# Patient Record
Sex: Male | Born: 1950 | Race: White | Hispanic: No | Marital: Married | Smoking: Current some day smoker
Health system: Southern US, Community
[De-identification: ages and names within clinical notes are randomized; demographics above are authoritative.]

## PROBLEM LIST (undated history)

## (undated) DIAGNOSIS — E785 Hyperlipidemia, unspecified: Secondary | ICD-10-CM

## (undated) DIAGNOSIS — F32A Depression, unspecified: Secondary | ICD-10-CM

## (undated) DIAGNOSIS — F329 Major depressive disorder, single episode, unspecified: Secondary | ICD-10-CM

## (undated) DIAGNOSIS — C801 Malignant (primary) neoplasm, unspecified: Secondary | ICD-10-CM

## (undated) DIAGNOSIS — M87051 Idiopathic aseptic necrosis of right femur: Secondary | ICD-10-CM

## (undated) DIAGNOSIS — M1990 Unspecified osteoarthritis, unspecified site: Secondary | ICD-10-CM

## (undated) DIAGNOSIS — I10 Essential (primary) hypertension: Secondary | ICD-10-CM

## (undated) HISTORY — PX: INGUINAL HERNIA REPAIR: SUR1180

## (undated) HISTORY — PX: ORCHIECTOMY: SHX2116

---

## 2004-07-16 ENCOUNTER — Ambulatory Visit: Payer: Self-pay | Admitting: General Surgery

## 2010-07-29 ENCOUNTER — Ambulatory Visit: Payer: Self-pay | Admitting: Sports Medicine

## 2011-01-19 ENCOUNTER — Emergency Department: Payer: Self-pay | Admitting: Internal Medicine

## 2011-02-01 ENCOUNTER — Ambulatory Visit: Payer: Self-pay | Admitting: Sports Medicine

## 2011-05-18 ENCOUNTER — Ambulatory Visit: Payer: Self-pay | Admitting: Sports Medicine

## 2011-08-08 ENCOUNTER — Other Ambulatory Visit: Payer: Self-pay | Admitting: Orthopedic Surgery

## 2011-08-18 ENCOUNTER — Other Ambulatory Visit: Payer: Self-pay

## 2011-08-18 ENCOUNTER — Encounter (HOSPITAL_COMMUNITY): Payer: Self-pay

## 2011-08-18 ENCOUNTER — Encounter (HOSPITAL_COMMUNITY): Payer: Self-pay | Admitting: Pharmacy Technician

## 2011-08-18 ENCOUNTER — Encounter (HOSPITAL_COMMUNITY)
Admission: RE | Admit: 2011-08-18 | Discharge: 2011-08-18 | Disposition: A | Discharge status: Home / Self Care | Payer: Worker's Compensation | Source: Ambulatory Visit | Attending: Orthopedic Surgery | Admitting: Orthopedic Surgery

## 2011-08-18 HISTORY — DX: Essential (primary) hypertension: I10

## 2011-08-18 HISTORY — DX: Unspecified osteoarthritis, unspecified site: M19.90

## 2011-08-18 LAB — CBC
HCT: 45.9 % (ref 39.0–52.0)
MCH: 33.3 pg (ref 26.0–34.0)
MCHC: 36.6 g/dL — ABNORMAL HIGH (ref 30.0–36.0)
MCV: 91.1 fL (ref 78.0–100.0)
RDW: 12.6 % (ref 11.5–15.5)

## 2011-08-18 LAB — DIFFERENTIAL
Basophils Absolute: 0.1 10*3/uL (ref 0.0–0.1)
Basophils Relative: 1 % (ref 0–1)
Eosinophils Absolute: 0.3 10*3/uL (ref 0.0–0.7)
Eosinophils Relative: 3 % (ref 0–5)
Monocytes Absolute: 0.9 10*3/uL (ref 0.1–1.0)
Neutro Abs: 4.9 10*3/uL (ref 1.7–7.7)

## 2011-08-18 LAB — BASIC METABOLIC PANEL
BUN: 27 mg/dL — ABNORMAL HIGH (ref 6–23)
Chloride: 103 mEq/L (ref 96–112)
Creatinine, Ser: 0.97 mg/dL (ref 0.50–1.35)
GFR calc Af Amer: >90 mL/min (ref >90)

## 2011-08-18 LAB — URINALYSIS, ROUTINE W REFLEX MICROSCOPIC
Glucose, UA: NEGATIVE mg/dL
Hgb urine dipstick: NEGATIVE
Leukocytes, UA: NEGATIVE
Specific Gravity, Urine: 1.027 (ref 1.005–1.030)
pH: 5.5 (ref 5.0–8.0)

## 2011-08-18 LAB — SURGICAL PCR SCREEN
MRSA, PCR: NEGATIVE
Staphylococcus aureus: NEGATIVE

## 2011-08-18 NOTE — Pre-Procedure Instructions (Addendum)
20 Robert Lester  08/18/2011   Your procedure is scheduled on:  April  8@0730   Report to [facility] at 0530 AM.  Call this number if you have problems the morning of surgery: [phone]   Remember:   Do not eat food:After Midnight.  May have clear liquids: up to 4 Hours before arrival.  Clear liquids include soda, tea, black coffee, apple or grape juice, broth.  Take these medicines the morning of surgery with A SIP OF WATER: BUSPAR,CELEXA,METPROLOL  Do not wear jewelry, make-up or nail polish.  Do not wear lotions, powders, or perfumes. You may wear deodorant.  Do not shave 48 hours prior to surgery.  Do not bring valuables to the hospital.  Contacts, dentures or bridgework may not be worn into surgery.  Leave suitcase in the car. After surgery it may be brought to your room.  For patients admitted to the hospital, checkout time is 11:00 AM the day of discharge.   Patients discharged the day of surgery will not be allowed to drive home.  Name and phone number of your driver: FAMILY  Special Instructions: CHG Shower Use Special Wash: 1/2 bottle night before surgery and 1/2 bottle morning of surgery.   Please read over the following fact sheets that you were given: Pain Booklet, Coughing and Deep Breathing, Blood Transfusion Information, Total Joint Packet, MRSA Information and Surgical Site Infection Prevention
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
## 2011-08-21 MED ORDER — CEFAZOLIN SODIUM-DEXTROSE 2-3 GM-% IV SOLR
2.0000 g | INTRAVENOUS | Status: AC
  Administered 2011-08-22: 2 g via INTRAVENOUS
  Filled 2011-08-21: qty 50

## 2011-08-22 ENCOUNTER — Encounter (HOSPITAL_COMMUNITY): Payer: Self-pay | Admitting: Anesthesiology

## 2011-08-22 ENCOUNTER — Ambulatory Visit (HOSPITAL_COMMUNITY): Payer: Worker's Compensation

## 2011-08-22 ENCOUNTER — Inpatient Hospital Stay (HOSPITAL_COMMUNITY)
Admission: RE | Admit: 2011-08-22 | Discharge: 2011-08-24 | DRG: 470 | Disposition: A | Discharge status: Home Health | Payer: Worker's Compensation | Source: Ambulatory Visit | Attending: Orthopedic Surgery | Admitting: Orthopedic Surgery

## 2011-08-22 ENCOUNTER — Encounter (HOSPITAL_COMMUNITY): Payer: Self-pay | Admitting: Orthopedic Surgery

## 2011-08-22 ENCOUNTER — Encounter (HOSPITAL_COMMUNITY): Payer: Self-pay | Admitting: *Deleted

## 2011-08-22 ENCOUNTER — Ambulatory Visit (HOSPITAL_COMMUNITY): Payer: Worker's Compensation | Admitting: Anesthesiology

## 2011-08-22 ENCOUNTER — Encounter (HOSPITAL_COMMUNITY)
Admission: RE | Disposition: A | Discharge status: Home Health | Payer: Self-pay | Source: Ambulatory Visit | Attending: Orthopedic Surgery

## 2011-08-22 DIAGNOSIS — I10 Essential (primary) hypertension: Secondary | ICD-10-CM | POA: Diagnosis present

## 2011-08-22 DIAGNOSIS — M1610 Unilateral primary osteoarthritis, unspecified hip: Principal | ICD-10-CM

## 2011-08-22 DIAGNOSIS — Z79899 Other long term (current) drug therapy: Secondary | ICD-10-CM

## 2011-08-22 DIAGNOSIS — Z7901 Long term (current) use of anticoagulants: Secondary | ICD-10-CM

## 2011-08-22 DIAGNOSIS — Z87891 Personal history of nicotine dependence: Secondary | ICD-10-CM

## 2011-08-22 DIAGNOSIS — M169 Osteoarthritis of hip, unspecified: Principal | ICD-10-CM | POA: Diagnosis present

## 2011-08-22 DIAGNOSIS — M8708 Idiopathic aseptic necrosis of bone, other site: Secondary | ICD-10-CM | POA: Diagnosis present

## 2011-08-22 HISTORY — PX: TOTAL HIP ARTHROPLASTY: SHX124

## 2011-08-22 SURGERY — ARTHROPLASTY, HIP, TOTAL,POSTERIOR APPROACH
Anesthesia: General | Site: Hip | Laterality: Right | Wound class: Clean

## 2011-08-22 MED ORDER — ZOLPIDEM TARTRATE 5 MG PO TABS: 5.0000 mg | ORAL_TABLET | Freq: Every evening | ORAL | Status: DC | PRN

## 2011-08-22 MED ORDER — METOPROLOL SUCCINATE ER 25 MG PO TB24
25.0000 mg | ORAL_TABLET | Freq: Every day | ORAL | Status: DC
  Filled 2011-08-22: qty 1

## 2011-08-22 MED ORDER — CITALOPRAM HYDROBROMIDE 20 MG PO TABS
20.0000 mg | ORAL_TABLET | Freq: Every day | ORAL | Status: DC
  Administered 2011-08-22 – 2011-08-24 (×3): 20 mg via ORAL
  Filled 2011-08-22 (×3): qty 1

## 2011-08-22 MED ORDER — CELECOXIB 200 MG PO CAPS
200.0000 mg | ORAL_CAPSULE | Freq: Two times a day (BID) | ORAL | Status: DC
  Administered 2011-08-22 – 2011-08-24 (×5): 200 mg via ORAL
  Filled 2011-08-22 (×6): qty 1

## 2011-08-22 MED ORDER — LISINOPRIL 10 MG PO TABS
10.0000 mg | ORAL_TABLET | Freq: Every day | ORAL | Status: DC
  Administered 2011-08-22: 10 mg via ORAL
  Filled 2011-08-22 (×3): qty 1

## 2011-08-22 MED ORDER — PROPOFOL 10 MG/ML IV EMUL
INTRAVENOUS | Status: DC | PRN
  Administered 2011-08-22: 50 mg via INTRAVENOUS
  Administered 2011-08-22: 150 mg via INTRAVENOUS

## 2011-08-22 MED ORDER — ACETAMINOPHEN 325 MG PO TABS: 650.0000 mg | ORAL_TABLET | Freq: Four times a day (QID) | ORAL | Status: DC | PRN

## 2011-08-22 MED ORDER — METHOCARBAMOL 500 MG PO TABS
500.0000 mg | ORAL_TABLET | Freq: Four times a day (QID) | ORAL | Status: DC | PRN
  Administered 2011-08-22 – 2011-08-24 (×3): 500 mg via ORAL
  Filled 2011-08-22 (×5): qty 1

## 2011-08-22 MED ORDER — METOCLOPRAMIDE HCL 5 MG/ML IJ SOLN: 5.0000 mg | Freq: Three times a day (TID) | INTRAMUSCULAR | Status: DC | PRN

## 2011-08-22 MED ORDER — ALUM & MAG HYDROXIDE-SIMETH 200-200-20 MG/5ML PO SUSP: 30.0000 mL | ORAL | Status: DC | PRN

## 2011-08-22 MED ORDER — ROCURONIUM BROMIDE 100 MG/10ML IV SOLN
INTRAVENOUS | Status: DC | PRN
  Administered 2011-08-22: 50 mg via INTRAVENOUS

## 2011-08-22 MED ORDER — CHLORHEXIDINE GLUCONATE 4 % EX LIQD: 60.0000 mL | Freq: Once | CUTANEOUS | Status: DC

## 2011-08-22 MED ORDER — LIDOCAINE HCL (CARDIAC) 20 MG/ML IV SOLN
INTRAVENOUS | Status: DC | PRN
  Administered 2011-08-22: 100 mg via INTRAVENOUS

## 2011-08-22 MED ORDER — LACTATED RINGERS IV SOLN
INTRAVENOUS | Status: DC | PRN
  Administered 2011-08-22 (×2): via INTRAVENOUS

## 2011-08-22 MED ORDER — ONDANSETRON HCL 4 MG/2ML IJ SOLN: 4.0000 mg | Freq: Four times a day (QID) | INTRAMUSCULAR | Status: DC | PRN

## 2011-08-22 MED ORDER — BUPIVACAINE-EPINEPHRINE 0.5% -1:200000 IJ SOLN
INTRAMUSCULAR | Status: DC | PRN
  Administered 2011-08-22: 10 mL

## 2011-08-22 MED ORDER — PHENYLEPHRINE HCL 10 MG/ML IJ SOLN
INTRAMUSCULAR | Status: DC | PRN
  Administered 2011-08-22: 80 ug via INTRAVENOUS

## 2011-08-22 MED ORDER — WARFARIN SODIUM 7.5 MG PO TABS
7.5000 mg | ORAL_TABLET | Freq: Once | ORAL | Status: AC
  Administered 2011-08-22: 7.5 mg via ORAL
  Filled 2011-08-22: qty 1

## 2011-08-22 MED ORDER — HYDROMORPHONE HCL PF 1 MG/ML IJ SOLN: 0.5000 mg | INTRAMUSCULAR | Status: DC | PRN

## 2011-08-22 MED ORDER — SIMVASTATIN 20 MG PO TABS
20.0000 mg | ORAL_TABLET | Freq: Every day | ORAL | Status: DC
  Administered 2011-08-22 – 2011-08-23 (×2): 20 mg via ORAL
  Filled 2011-08-22 (×3): qty 1

## 2011-08-22 MED ORDER — OXYCODONE HCL 5 MG PO TABS
5.0000 mg | ORAL_TABLET | ORAL | Status: DC | PRN
  Administered 2011-08-23: 10 mg via ORAL
  Filled 2011-08-22: qty 2

## 2011-08-22 MED ORDER — BUSPIRONE HCL 15 MG PO TABS
15.0000 mg | ORAL_TABLET | Freq: Two times a day (BID) | ORAL | Status: DC
  Administered 2011-08-22 – 2011-08-24 (×5): 15 mg via ORAL
  Filled 2011-08-22 (×9): qty 1

## 2011-08-22 MED ORDER — BISACODYL 10 MG RE SUPP: 10.0000 mg | Freq: Every day | RECTAL | Status: DC | PRN

## 2011-08-22 MED ORDER — METHOCARBAMOL 100 MG/ML IJ SOLN
500.0000 mg | Freq: Four times a day (QID) | INTRAVENOUS | Status: DC | PRN
  Administered 2011-08-22: 500 mg via INTRAVENOUS
  Filled 2011-08-22: qty 5

## 2011-08-22 MED ORDER — COUMADIN BOOK
Freq: Once | Status: AC
  Administered 2011-08-22: 12:00:00
  Filled 2011-08-22: qty 1

## 2011-08-22 MED ORDER — ACETAMINOPHEN 650 MG RE SUPP: 650.0000 mg | Freq: Four times a day (QID) | RECTAL | Status: DC | PRN

## 2011-08-22 MED ORDER — MENTHOL 3 MG MT LOZG: 1.0000 | LOZENGE | OROMUCOSAL | Status: DC | PRN

## 2011-08-22 MED ORDER — DEXTROSE 5 % IV SOLN
INTRAVENOUS | Status: DC | PRN
  Administered 2011-08-22 (×2): via INTRAVENOUS

## 2011-08-22 MED ORDER — FLEET ENEMA 7-19 GM/118ML RE ENEM: 1.0000 | ENEMA | Freq: Once | RECTAL | Status: AC | PRN

## 2011-08-22 MED ORDER — PROMETHAZINE HCL 25 MG/ML IJ SOLN: 6.2500 mg | INTRAMUSCULAR | Status: DC | PRN

## 2011-08-22 MED ORDER — MIDAZOLAM HCL 5 MG/5ML IJ SOLN
INTRAMUSCULAR | Status: DC | PRN
  Administered 2011-08-22: 2 mg via INTRAVENOUS

## 2011-08-22 MED ORDER — ACETAMINOPHEN 10 MG/ML IV SOLN
INTRAVENOUS | Status: AC
  Filled 2011-08-22: qty 100

## 2011-08-22 MED ORDER — WARFARIN VIDEO
Freq: Once | Status: AC
Start: 2011-08-23 — End: 2011-08-23
  Administered 2011-08-23: 12:00:00

## 2011-08-22 MED ORDER — OXYCODONE-ACETAMINOPHEN 5-325 MG PO TABS
1.0000 | ORAL_TABLET | ORAL | Status: DC | PRN
  Administered 2011-08-23: 2 via ORAL
  Administered 2011-08-23 (×2): 1 via ORAL
  Administered 2011-08-24: 2 via ORAL
  Filled 2011-08-22 (×3): qty 2

## 2011-08-22 MED ORDER — MAGNESIUM HYDROXIDE 400 MG/5ML PO SUSP: 30.0000 mL | Freq: Every day | ORAL | Status: DC | PRN

## 2011-08-22 MED ORDER — ACETAMINOPHEN 10 MG/ML IV SOLN
INTRAVENOUS | Status: DC | PRN
  Administered 2011-08-22: 1000 mg via INTRAVENOUS

## 2011-08-22 MED ORDER — KCL IN DEXTROSE-NACL 20-5-0.45 MEQ/L-%-% IV SOLN
INTRAVENOUS | Status: DC
Start: 2011-08-22 — End: 2011-08-24
  Administered 2011-08-22 – 2011-08-23 (×4): via INTRAVENOUS
  Filled 2011-08-22 (×10): qty 1000

## 2011-08-22 MED ORDER — NEOSTIGMINE METHYLSULFATE 1 MG/ML IJ SOLN
INTRAMUSCULAR | Status: DC | PRN
  Administered 2011-08-22: 3 mg via INTRAVENOUS

## 2011-08-22 MED ORDER — METOCLOPRAMIDE HCL 10 MG PO TABS: 5.0000 mg | ORAL_TABLET | Freq: Three times a day (TID) | ORAL | Status: DC | PRN

## 2011-08-22 MED ORDER — KCL IN DEXTROSE-NACL 20-5-0.45 MEQ/L-%-% IV SOLN
INTRAVENOUS | Status: AC
  Filled 2011-08-22: qty 1000

## 2011-08-22 MED ORDER — ONDANSETRON HCL 4 MG/2ML IJ SOLN
INTRAMUSCULAR | Status: DC | PRN
  Administered 2011-08-22: 4 mg via INTRAVENOUS

## 2011-08-22 MED ORDER — DIPHENHYDRAMINE HCL 12.5 MG/5ML PO ELIX: 12.5000 mg | ORAL_SOLUTION | ORAL | Status: DC | PRN

## 2011-08-22 MED ORDER — SODIUM CHLORIDE 0.9 % IR SOLN
Status: DC | PRN
  Administered 2011-08-22: 1000 mL

## 2011-08-22 MED ORDER — HYDROCODONE-ACETAMINOPHEN 5-325 MG PO TABS
1.0000 | ORAL_TABLET | ORAL | Status: DC | PRN
  Administered 2011-08-22: 1 via ORAL
  Administered 2011-08-23 – 2011-08-24 (×2): 2 via ORAL
  Filled 2011-08-22: qty 1
  Filled 2011-08-22 (×2): qty 2

## 2011-08-22 MED ORDER — WARFARIN - PHARMACIST DOSING INPATIENT: Freq: Every day | Status: DC

## 2011-08-22 MED ORDER — METOPROLOL SUCCINATE ER 25 MG PO TB24
25.0000 mg | ORAL_TABLET | Freq: Every day | ORAL | Status: DC
  Administered 2011-08-22: 25 mg via ORAL
  Filled 2011-08-22 (×3): qty 1

## 2011-08-22 MED ORDER — GLYCOPYRROLATE 0.2 MG/ML IJ SOLN
INTRAMUSCULAR | Status: DC | PRN
  Administered 2011-08-22: 0.4 mg via INTRAVENOUS

## 2011-08-22 MED ORDER — PHENOL 1.4 % MT LIQD: 1.0000 | OROMUCOSAL | Status: DC | PRN

## 2011-08-22 MED ORDER — FENTANYL CITRATE 0.05 MG/ML IJ SOLN
INTRAMUSCULAR | Status: DC | PRN
  Administered 2011-08-22 (×2): 100 ug via INTRAVENOUS
  Administered 2011-08-22: 50 ug via INTRAVENOUS

## 2011-08-22 MED ORDER — ZOLPIDEM TARTRATE 10 MG PO TABS: 10.0000 mg | ORAL_TABLET | Freq: Every evening | ORAL | Status: DC | PRN

## 2011-08-22 MED ORDER — DEXTROSE-NACL 5-0.45 % IV SOLN: INTRAVENOUS | Status: DC

## 2011-08-22 MED ORDER — HYDROMORPHONE HCL PF 1 MG/ML IJ SOLN
0.2500 mg | INTRAMUSCULAR | Status: DC | PRN
  Administered 2011-08-22 (×2): 0.5 mg via INTRAVENOUS

## 2011-08-22 MED ORDER — ONDANSETRON HCL 4 MG PO TABS: 4.0000 mg | ORAL_TABLET | Freq: Four times a day (QID) | ORAL | Status: DC | PRN

## 2011-08-22 MED ORDER — METOPROLOL SUCCINATE ER 25 MG PO TB24
25.0000 mg | ORAL_TABLET | Freq: Once | ORAL | Status: AC
  Administered 2011-08-22: 25 mg via ORAL

## 2011-08-22 SURGICAL SUPPLY — 60 items
BLADE SAW SAG 73X25 THK (BLADE) ×1
BLADE SAW SGTL 18X1.27X75 (BLADE) IMPLANT
BLADE SAW SGTL 73X25 THK (BLADE) ×1 IMPLANT
BLADE SAW SGTL MED 73X18.5 STR (BLADE) IMPLANT
BRUSH FEMORAL CANAL (MISCELLANEOUS) IMPLANT
CLOTH BEACON ORANGE TIMEOUT ST (SAFETY) ×2 IMPLANT
COVER BACK TABLE 24X17X13 BIG (DRAPES) IMPLANT
COVER SURGICAL LIGHT HANDLE (MISCELLANEOUS) ×4 IMPLANT
DRAPE ORTHO SPLIT 77X108 STRL (DRAPES) ×1
DRAPE PROXIMA HALF (DRAPES) ×2 IMPLANT
DRAPE SURG ORHT 6 SPLT 77X108 (DRAPES) ×1 IMPLANT
DRAPE U-SHAPE 47X51 STRL (DRAPES) ×2 IMPLANT
DRILL BIT 7/64X5 (BIT) ×2 IMPLANT
DRSG MEPILEX BORDER 4X12 (GAUZE/BANDAGES/DRESSINGS) IMPLANT
DRSG MEPILEX BORDER 4X4 (GAUZE/BANDAGES/DRESSINGS) ×2 IMPLANT
DRSG MEPILEX BORDER 4X8 (GAUZE/BANDAGES/DRESSINGS) IMPLANT
DURAPREP 26ML APPLICATOR (WOUND CARE) ×2 IMPLANT
ELECT BLADE 4.0 EZ CLEAN MEGAD (MISCELLANEOUS) ×2
ELECT REM PT RETURN 9FT ADLT (ELECTROSURGICAL) ×2
ELECTRODE BLDE 4.0 EZ CLN MEGD (MISCELLANEOUS) ×1 IMPLANT
ELECTRODE REM PT RTRN 9FT ADLT (ELECTROSURGICAL) ×1 IMPLANT
FLOSEAL 10ML (HEMOSTASIS) IMPLANT
GAUZE XEROFORM 1X8 LF (GAUZE/BANDAGES/DRESSINGS) IMPLANT
GLOVE BIO SURGEON STRL SZ7 (GLOVE) ×2 IMPLANT
GLOVE BIO SURGEON STRL SZ7.5 (GLOVE) ×2 IMPLANT
GLOVE BIO SURGEON STRL SZ8.5 (GLOVE) ×2 IMPLANT
GLOVE BIOGEL PI IND STRL 6.5 (GLOVE) ×1 IMPLANT
GLOVE BIOGEL PI IND STRL 7.0 (GLOVE) ×1 IMPLANT
GLOVE BIOGEL PI IND STRL 8 (GLOVE) ×1 IMPLANT
GLOVE BIOGEL PI INDICATOR 6.5 (GLOVE) ×1
GLOVE BIOGEL PI INDICATOR 7.0 (GLOVE) ×1
GLOVE BIOGEL PI INDICATOR 8 (GLOVE) ×1
GLOVE SURG SS PI 8.5 STRL IVOR (GLOVE) ×1
GLOVE SURG SS PI 8.5 STRL STRW (GLOVE) ×1 IMPLANT
GOWN PREVENTION PLUS XLARGE (GOWN DISPOSABLE) ×4 IMPLANT
GOWN STRL NON-REIN LRG LVL3 (GOWN DISPOSABLE) ×4 IMPLANT
HANDPIECE INTERPULSE COAX TIP (DISPOSABLE)
HOOD PEEL AWAY FACE SHEILD DIS (HOOD) ×4 IMPLANT
KIT BASIN OR (CUSTOM PROCEDURE TRAY) ×2 IMPLANT
KIT ROOM TURNOVER OR (KITS) ×2 IMPLANT
MANIFOLD NEPTUNE II (INSTRUMENTS) ×2 IMPLANT
NEEDLE 22X1 1/2 (OR ONLY) (NEEDLE) ×2 IMPLANT
NS IRRIG 1000ML POUR BTL (IV SOLUTION) ×2 IMPLANT
PACK TOTAL JOINT (CUSTOM PROCEDURE TRAY) ×2 IMPLANT
PAD ARMBOARD 7.5X6 YLW CONV (MISCELLANEOUS) ×4 IMPLANT
PASSER SUT SWANSON 36MM LOOP (INSTRUMENTS) ×2 IMPLANT
PRESSURIZER FEMORAL UNIV (MISCELLANEOUS) IMPLANT
SET HNDPC FAN SPRY TIP SCT (DISPOSABLE) IMPLANT
SUT ETHIBOND 2 V 37 (SUTURE) ×2 IMPLANT
SUT ETHILON 3 0 FSL (SUTURE) ×2 IMPLANT
SUT VIC AB 0 CTB1 27 (SUTURE) ×2 IMPLANT
SUT VIC AB 1 CTX 36 (SUTURE) ×1
SUT VIC AB 1 CTX36XBRD ANBCTR (SUTURE) ×1 IMPLANT
SUT VIC AB 2-0 CTB1 (SUTURE) ×2 IMPLANT
SYR CONTROL 10ML LL (SYRINGE) ×2 IMPLANT
TOWEL OR 17X24 6PK STRL BLUE (TOWEL DISPOSABLE) ×2 IMPLANT
TOWEL OR 17X26 10 PK STRL BLUE (TOWEL DISPOSABLE) ×2 IMPLANT
TOWER CARTRIDGE SMART MIX (DISPOSABLE) IMPLANT
TRAY FOLEY CATH 14FR (SET/KITS/TRAYS/PACK) ×2 IMPLANT
WATER STERILE IRR 1000ML POUR (IV SOLUTION) ×4 IMPLANT

## 2011-08-22 NOTE — Progress Notes (Signed)
Orthopedic Tech Progress Note Patient Details:  Robert Lester [birth year] [id number]  Patient ID: [name], male   DOB: 1951/01/24, 61 y.o.   MRN: [id number]   Shawnie Pons 08/22/2011, 4:32 PM Trapeze bar

## 2011-08-22 NOTE — Op Note (Signed)
OPERATIVE REPORT    DATE OF PROCEDURE:  08/22/2011       PREOPERATIVE DIAGNOSIS:  OSTEOARTHRITIS RIGHT HIP                                                          POSTOPERATIVE DIAGNOSIS:  OSTEOARTHRITIS RIGHT HIP                                                           PROCEDURE:  R total hip arthroplasty using a 54 mm DePuy Pinnacle  Cup, [employer], 10-degree polyethylene liner index superior  and posterior, a +0 36 mm ceramic head, a [id number] SROM stem, 18Fsm Cone   SURGEON: Carina Chaplin J    ASSISTANT:   Mauricia Area, PA-C  (present throughout entire procedure and necessary for timely completion of the procedure)   ANESTHESIA: General BLOOD LOSS: 300 FLUID REPLACEMENT: 1500 crystalloid DRAINS: Foley Catheter URINE OUTPUT: 300cc COMPLICATIONS: none    INDICATIONS FOR PROCEDURE: A 61 y.o. year-old With  OSTEOARTHRITIS RIGHT HIP   for 1 years, x-rays show bone-on-bone arthritic changes. Despite conservative measures with observation, anti-inflammatory medicine, narcotics, use of a cane, has severe unremitting pain and can ambulate only a few blocks before resting.  Patient desires elective R total hip arthroplasty to decrease pain and increase function. The risks, benefits, and alternatives were discussed at length including but not limited to the risks of infection, bleeding, nerve injury, stiffness, blood clots, the need for revision surgery, cardiopulmonary complications, among others, and they were willing to proceed.y have been discussed. Questions answered.     PROCEDURE IN DETAIL: The patient was identified by armband,  received preoperative IV antibiotics in the holding area at [facility], taken to the operating room , appropriate anesthetic monitors  were attached and general endotracheal anesthesia induced. Foley catheter was inserted. He was rolled into the L lateral decubitus position and fixed there with a Stulberg Mark II pelvic clamp and the R    lower extremity was then prepped and draped  in the usual sterile fashion from the ankle to the hemipelvis. A time-out  procedure was performed. The skin along the lateral hip and thigh  infiltrated with 10 mL of 0.5% Marcaine and epinephrine solution. We  then made a posterolateral approach to the hip. With a #10 blade, 12 cm  incision through skin and subcutaneous tissue down to the level of the  IT band. Small bleeders were identified and cauterized. IT band cut in  line with skin incision exposing the greater trochanter. A Cobra retractor was placed between the gluteus minimus and the superior hip joint capsule, and a spiked Cobra between the quadratus femoris and the inferior hip joint capsule. This isolated the short  external rotators and piriformis tendons. These were tagged with a #2 Ethibond  suture and cut off their insertion on the intertrochanteric crest. The posterior  capsule was then developed into an acetabular-based flap from Posterior Superior off of the acetabulum out over the femoral neck and back posterior inferior to the acetabular rim. This flap was tagged with two #2 Ethibond sutures and  retracted protecting the sciatic nerve. This exposed the arthritic femoral head and osteophytes. The hip was then flexed and internally rotated, dislocating the femoral head and a standard neck cut performed 1 fingerbreadth above the lesser trochanter.  A spiked Cobra was placed in the cotyloid notch and a Hohmann retractor was then used to lever the femur anteriorly off of the anterior pelvic column. A posterior-inferior wing retractor was placed at the junction of the acetabulum and the ischium completing the acetabular exposure.We then removed the peripheral osteophytes and labrum from the acetabulum. We then reamed the acetabulum up to 53 mm with basket reamers obtaining good coverage in all quadrants, irrigated out with normal  saline solution and hammered into place a 54 mm pinnacle cup  in 45  degrees of abduction and about 20 degrees of anteversion. More  peripheral osteophytes removed and a trial 10-degree liner placed with the  index superior-posterior. The hip was then flexed and internally rotated exposing the  proximal femur, which was entered with the initiating reamer followed by  the axial reamers up to a 13.5 mm full depth and 14mm partial depth. We then conically reamed to 78F to the correct depth for a 42 base neck. The calcar was milled to 78Fsm. A trial cone and stem was inserted in the 25 degrees anteversion, with a +0 36mm trial head. Trial reduction was then performed and excellent stability was noted with at 90 of flexion with 75 of internal rotation and then full extension with maximal external rotation. The hip could not be dislocated in full extension. The knee could easily flex  to about 130 degrees. We also stretched the abductors at this point,  because of the preexisting adductor contractures. All trial components  were then removed. The acetabulum was irrigated out with normal saline  solution. A titanium Apex [facility] was then screwed into place  followed by a 10-degree polyethylene liner index superior-posterior. On  the femoral side a 78Fsm ZTT1 cone was hammered into place, followed by a [id number] SROM stem in 25 degrees of anteversion. At this point, a +0 36 mm ceramic head was  hammered on the stem. The hip was reduced. We checked our stability  one more time and found to be excellent. The wound was once again  thoroughly irrigated out with normal saline solution pulse lavage. The  capsular flap and short external rotators were repaired back to the  intertrochanteric crest through drill holes with a #2 Ethibond suture.  The IT band was closed with running 1 Vicryl suture. The subcutaneous  tissue with 0 and 2-0 undyed Vicryl suture and the skin with running  interlocking 3-0 nylon suture. Dressing of Xeroform and Mepilex was  then  applied. The patient was then unclamped, rolled supine, awaken extubated and taken to recovery room without difficulty in stable condition.   Joyceline Maiorino J 08/22/2011, 8:47 AM

## 2011-08-22 NOTE — Interval H&P Note (Signed)
History and Physical Interval Note:  08/22/2011 7:17 AM  Robert Lester  has presented today for surgery, with the diagnosis of OSTEOARTHRITIS RIGHT HIP  The various methods of treatment have been discussed with the patient and family. After consideration of risks, benefits and other options for treatment, the patient has consented to  Procedure(s) (LRB): TOTAL HIP ARTHROPLASTY (Right) as a surgical intervention .  The patients' history has been reviewed, patient examined, no change in status, stable for surgery.  I have reviewed the patients' chart and labs.  Questions were answered to the patient's satisfaction.     [name]

## 2011-08-22 NOTE — Transfer of Care (Signed)
Immediate Anesthesia Transfer of Care Note  Patient: Robert Lester  Procedure(s) Performed: Procedure(s) (LRB): TOTAL HIP ARTHROPLASTY (Right)  Patient Location: PACU  Anesthesia Type: General  Level of Consciousness: awake, alert , oriented and patient cooperative  Airway & Oxygen Therapy: Patient Spontanous Breathing and Patient connected to nasal cannula oxygen  Post-op Assessment: Report given to PACU RN and Post -op Vital signs reviewed and stable  Post vital signs: Reviewed and stable  Complications: No apparent anesthesia complications

## 2011-08-22 NOTE — Progress Notes (Signed)
Pt's wife was very concerned about pt not receiving antibiotics postoperatively and repeatedly stated "I don't want you to get an infection while you are here." I explained to her that he received a dose of antibiotics preoperatively and that Dr. Turner Daniels does not require postop antibiotics. I called and clarified this with Dr. Turner Daniels. He stated that this was correct and that he reviewed pt's postop xray which "looked great." Informed pt and wife that I spoke with Dr. Turner Daniels and his comment. They were satisfied with the follow-up and response. Tammy Sours

## 2011-08-22 NOTE — Progress Notes (Signed)
ANTICOAGULATION CONSULT NOTE - Initial Consult  Pharmacy Consult for Coumadin Indication: VTE prophylaxis s/p R THA  No Known Allergies  Patient Measurements: Ht: 70 inches Wt: 89.8 kg   Vital Signs: Temp: 97.7 F (36.5 C) (04/08 1011) Temp src: Oral (04/08 0611) BP: 145/79 mmHg (04/08 1011) Pulse Rate: 68  (04/08 1011)  Labs: No results found for this basename: HGB:2,HCT:3,PLT:3,APTT:3,LABPROT:3,INR:3,HEPARINUNFRC:3,CREATININE:3,CKTOTAL:3,CKMB:3,TROPONINI:3 in the last 72 hours CrCl is unknown because there is no height on file for the current visit.  Medical History: Past Medical History  Diagnosis Date  . Arthritis   . Hypertension     TX DR Doristine Church [facility]    Medications:  Prescriptions prior to admission  Medication Sig Dispense Refill  . busPIRone (BUSPAR) 15 MG tablet Take 15 mg by mouth 2 (two) times daily.      . celecoxib (CELEBREX) 200 MG capsule Take 200 mg by mouth 2 (two) times daily.      . ciprofloxacin (CIPRO) 500 MG tablet Take 500 mg by mouth 2 (two) times daily.      . citalopram (CELEXA) 20 MG tablet Take 20 mg by mouth daily.      Marland Kitchen HYDROcodone-acetaminophen (NORCO) 5-325 MG per tablet Take 1 tablet by mouth every 6 (six) hours as needed. For pain      . HYDROcodone-acetaminophen (VICODIN) 5-500 MG per tablet Take 1 tablet by mouth every 6 (six) hours as needed. For pain      . lisinopril (PRINIVIL,ZESTRIL) 10 MG tablet Take 10 mg by mouth daily.      Marland Kitchen lovastatin (MEVACOR) 20 MG tablet Take 20 mg by mouth at bedtime.      . metoprolol succinate (TOPROL-XL) 25 MG 24 hr tablet Take 25 mg by mouth daily.      Marland Kitchen zolpidem (AMBIEN) 10 MG tablet Take 10 mg by mouth at bedtime as needed. For sleep        Assessment: 61 y.o. male to begin coumadin for VTE prophylaxis s/p R THA 4/8. INR 1.07 at baseline and CBC stable.   Goal of Therapy:  INR 2-3   Plan:  1. Coumadin 7.5 mg po today 2. Coumadin educational booklet and video 3. Daily  PT/INR  Christoper Fabian, PharmD, BCPS Clinical pharmacist, pager [phone] 08/22/2011,11:08 AM

## 2011-08-22 NOTE — Interval H&P Note (Signed)
History and Physical Interval Note:  08/22/2011 7:18 AM  Robert Lester  has presented today for surgery, with the diagnosis of OSTEOARTHRITIS RIGHT HIP  The various methods of treatment have been discussed with the patient and family. After consideration of risks, benefits and other options for treatment, the patient has consented to  Procedure(s) (LRB): TOTAL HIP ARTHROPLASTY (Right) as a surgical intervention .  The patients' history has been reviewed, patient examined, no change in status, stable for surgery.  I have reviewed the patients' chart and labs.  Questions were answered to the patient's satisfaction.     [name]

## 2011-08-22 NOTE — Anesthesia Postprocedure Evaluation (Signed)
  Anesthesia Post-op Note  Patient: Robert Lester  Procedure(s) Performed: Procedure(s) (LRB): TOTAL HIP ARTHROPLASTY (Right)  Patient Location: PACU  Anesthesia Type: General  Level of Consciousness: awake and alert   Airway and Oxygen Therapy: Patient Spontanous Breathing  Post-op Pain: mild  Post-op Assessment: Post-op Vital signs reviewed, Patient's Cardiovascular Status Stable, Respiratory Function Stable, Patent Airway and No signs of Nausea or vomiting  Post-op Vital Signs: stable  Complications: No apparent anesthesia complications

## 2011-08-22 NOTE — Evaluation (Signed)
Physical Therapy Evaluation Patient Details Name: Robert Lester MRN: [id number] DOB: May 24, 1950 Today's Date: 08/22/2011  Problem List:  Patient Active Problem List  Diagnoses  . Hip arthritis Right    Past Medical History:  Past Medical History  Diagnosis Date  . Arthritis   . Hypertension     TX DR Doristine Church [facility]   Past Surgical History:  Past Surgical History  Procedure Date  . Inguinal hernia repair     PT Assessment/Plan/Recommendation PT Assessment Clinical Impression Statement: Pt is a 61 y/o male post op day 0 of Right total hip replacement.  Pt should progress quickly.  Acute PT to follow pt to maximize independence with mobility.  PT Recommendation/Assessment: Patient will need skilled PT in the acute care venue PT Problem List: Decreased strength;Decreased activity tolerance;Decreased mobility;Decreased knowledge of use of DME;Decreased knowledge of precautions;Pain Barriers to Discharge: None PT Therapy Diagnosis : Difficulty walking;Acute pain PT Plan PT Frequency: 7X/week PT Treatment/Interventions: Gait training;Stair training;DME instruction;Functional mobility training;Therapeutic activities;Therapeutic exercise;Neuromuscular re-education;Patient/family education PT Recommendation Follow Up Recommendations: Home health PT Equipment Recommended: None recommended by PT (Pt has all required equipment. ) PT Goals  Acute Rehab PT Goals PT Goal Formulation: With patient Time For Goal Achievement: 7 days Pt will go Supine/Side to Sit: Independently;with HOB 0 degrees PT Goal: Supine/Side to Sit - Progress: Goal set today Pt will go Sit to Supine/Side: Independently;with HOB 0 degrees PT Goal: Sit to Supine/Side - Progress: Goal set today Pt will Transfer Bed to Chair/Chair to Bed: Independently PT Transfer Goal: Bed to Chair/Chair to Bed - Progress: Goal set today Pt will Ambulate: >150 feet;with modified independence;with least restrictive  assistive device PT Goal: Ambulate - Progress: Goal set today Pt will Go Up / Down Stairs: 3-5 stairs;with modified independence;with least restrictive assistive device PT Goal: Up/Down Stairs - Progress: Goal set today Pt will Perform Home Exercise Program: Independently PT Goal: Perform Home Exercise Program - Progress: Goal set today Additional Goals Additional Goal #1: Pt will verbalize and Demonstrate 3/3 posterior hip precautions without prompting.  PT Goal: Additional Goal #1 - Progress: Goal set today  PT Evaluation Precautions/Restrictions  Precautions Precautions: Posterior Hip Precaution Booklet Issued: Yes (comment) Required Braces or Orthoses: No Restrictions Weight Bearing Restrictions: Yes RLE Weight Bearing: Weight bearing as tolerated Prior Functioning  Home Living Lives With: Spouse Receives Help From: Family Type of Home: House Home Layout: One level Home Access: Stairs to enter Entrance Stairs-Rails: Left Entrance Stairs-Number of Steps: 3 Bathroom Shower/Tub: Psychologist, counselling;Door [employer] Toilet: Standard Bathroom Accessibility: Yes How Accessible: Accessible via wheelchair;Accessible via walker Home Adaptive Equipment: Bedside commode/3-in-1;Built-in shower seat;Grab bars around toilet;Grab bars in shower;Hand-held shower hose;Walker - rolling Prior Function Level of Independence: Independent with basic ADLs;Independent with homemaking with ambulation;Independent with gait;Independent with transfers Able to Take Stairs?: Yes Driving: Yes Vocation: Full time employment Vocation Requirements: Engineer, drilling.  Leisure: Hobbies-yes (Comment) Comments: Motorcycle.  Cognition Cognition Arousal/Alertness: Awake/alert Overall Cognitive Status: Appears within functional limits for tasks assessed Orientation Level: Oriented X4 Sensation/Coordination Sensation Light Touch: Appears Intact Stereognosis: Not tested Hot/Cold: Not tested Proprioception:  Appears Intact Coordination Gross Motor Movements are Fluid and Coordinated: Yes Fine Motor Movements are Fluid and Coordinated: Yes Extremity Assessment RUE Assessment RUE Assessment: Within Functional Limits LUE Assessment LUE Assessment: Within Functional Limits RLE Assessment RLE Assessment: Exceptions to [facility] RLE Strength RLE Overall Strength: Deficits;Due to pain;Other (Comment) RLE Overall Strength Comments: 3-/5 gross hip strength.   LLE Assessment LLE Assessment: Within  Functional Limits Mobility (including Balance) Bed Mobility Bed Mobility: Yes Supine to Sit: 4: Min assist;HOB flat;Patient percentage (comment) (Pt 90%) Supine to Sit Details (indicate cue type and reason): Verbal instruction of safe technique.  Min assist for Rt LE management secondary to pain and weakness.   Transfers Transfers: Yes Sit to Stand: 4: Min assist;From bed;With upper extremity assist;Patient percentage (comment) (Pt  90%) Sit to Stand Details (indicate cue type and reason): Demonstrated and explained proper technique including hand placement and Rt LE positioning to maintain posterior hip precautions.  Pt able to return demonstrate with min assist to stabilize pt  Stand to Sit: 4: Min assist;With upper extremity assist;To chair/3-in-1;With armrests (Pt 90%) Stand to Sit Details: Same as sit to stand.  Ambulation/Gait Ambulation/Gait: Yes Ambulation/Gait Assistance: 5: Supervision Ambulation/Gait Assistance Details (indicate cue type and reason): Verbal cues for gait sequencing, WBAT on Right LE, and proper distancing from RW.   Ambulation Distance (Feet): 25 Feet Assistive device: Rolling walker Gait Pattern: Step-to pattern;Decreased stance time - right Stairs: No Wheelchair Mobility Wheelchair Mobility: No  Posture/Postural Control Posture/Postural Control: No significant limitations Balance Balance Assessed: No Exercise  Total Joint Exercises Ankle Circles/Pumps: AROM;Both;10  reps;Supine;Seated (Pt instructed to perform 10 every hour.  ) Gluteal Sets: Both;10 reps;Supine (Pt instructed to perform 10 every hour. ) Heel Slides: Right;5 reps;Supine (Pt instructed to perform 3 times per day. ) End of Session PT - End of Session Equipment Utilized During Treatment: Gait belt Activity Tolerance: Patient tolerated treatment well Patient left: in chair;with call bell in reach Nurse Communication: Mobility status for transfers;Mobility status for ambulation General Behavior During Session: [facility] for tasks performed Cognition: [facility] for tasks performed  Thaddus Mcdowell 08/22/2011, 6:20 PM Shristi Scheib L. Chivonne Rascon DPT [phone]

## 2011-08-22 NOTE — Anesthesia Procedure Notes (Signed)
Procedure Name: Intubation Date/Time: 08/22/2011 7:37 AM Performed by: Tyrone Nine Pre-anesthesia Checklist: Patient identified, Emergency Drugs available, Suction available, Patient being monitored and Timeout performed Patient Re-evaluated:Patient Re-evaluated prior to inductionOxygen Delivery Method: Circle system utilized Preoxygenation: Pre-oxygenation with 100% oxygen Intubation Type: IV induction Ventilation: Mask ventilation with difficulty Laryngoscope Size: Mac and 3 Grade View: Grade I Tube type: Oral Number of attempts: 1 Airway Equipment and Method: Stylet Placement Confirmation: ETT inserted through vocal cords under direct vision,  positive ETCO2 and breath sounds checked- equal and bilateral Secured at: 21 cm Tube secured with: Tape Dental Injury: Teeth and Oropharynx as per pre-operative assessment

## 2011-08-22 NOTE — Plan of Care (Signed)
Problem: Consults Goal: Diagnosis- Total Joint Replacement Outcome: Completed/Met Date Met:  08/22/11 Primary Total Hip Right

## 2011-08-22 NOTE — Preoperative (Addendum)
Beta Blockers   Reason not to administer Beta Blockers:Took Toprol @ 08/22/2011 6:45

## 2011-08-22 NOTE — Progress Notes (Signed)
CARE MANAGEMENT NOTE 08/22/2011  Patient:  Robert Lester, Robert Lester   Account Number:  [id number]  Date Initiated:  08/22/2011  Documentation initiated by:  Vance Peper  Subjective/Objective Assessment:   61 yr old male s/p right total hip arthroplasty     Action/Plan:   Patient is under worker's comp. Called [phone] ext [phone]- Left Message for Joni Reining, RN CM. Will follow   Anticipated DC Date:  08/24/2011   Anticipated DC Plan:  HOME W HOME HEALTH SERVICES         Choice offered to / List presented to:             Status of service:  In process, will continue to follow Discharge Disposition:

## 2011-08-22 NOTE — Anesthesia Preprocedure Evaluation (Addendum)
Anesthesia Evaluation  Patient identified by MRN, date of birth, ID band Patient awake    Reviewed: Allergy & Precautions, H&P , NPO status , Patient's Chart, lab work & pertinent test results  Airway Mallampati: II TM Distance: <3 FB Neck ROM: Full    Dental No notable dental hx. (+) Dental Advisory Given and Teeth Intact   Pulmonary neg pulmonary ROS,  breath sounds clear to auscultation  Pulmonary exam normal       Cardiovascular hypertension, Pt. on medications and Pt. on home beta blockers Rhythm:Regular Rate:Normal     Neuro/Psych Anxiety Depression negative neurological ROS  negative psych ROS   GI/Hepatic negative GI ROS, Neg liver ROS,   Endo/Other  negative endocrine ROS  Renal/GU negative Renal ROS  negative genitourinary   Musculoskeletal negative musculoskeletal ROS (+)   Abdominal   Peds negative pediatric ROS (+)  Hematology negative hematology ROS (+)   Anesthesia Other Findings   Reproductive/Obstetrics negative OB ROS                         Anesthesia Physical Anesthesia Plan  ASA: II  Anesthesia Plan: General   Post-op Pain Management:    Induction: Intravenous  Airway Management Planned: Oral ETT  Additional Equipment:   Intra-op Plan:   Post-operative Plan: Extubation in OR  Informed Consent: I have reviewed the patients History and Physical, chart, labs and discussed the procedure including the risks, benefits and alternatives for the proposed anesthesia with the patient or authorized representative who has indicated his/her understanding and acceptance.   Dental advisory given  Plan Discussed with: CRNA  Anesthesia Plan Comments:         Anesthesia Quick Evaluation

## 2011-08-23 ENCOUNTER — Encounter (HOSPITAL_COMMUNITY): Payer: Self-pay | Admitting: Orthopedic Surgery

## 2011-08-23 LAB — PROTIME-INR
INR: 1.36 (ref 0.00–1.49)
Prothrombin Time: 17 seconds — ABNORMAL HIGH (ref 11.6–15.2)

## 2011-08-23 LAB — BASIC METABOLIC PANEL
Chloride: 103 mEq/L (ref 96–112)
GFR calc Af Amer: 83 mL/min — ABNORMAL LOW (ref >90)
GFR calc non Af Amer: 72 mL/min — ABNORMAL LOW (ref >90)
Glucose, Bld: 142 mg/dL — ABNORMAL HIGH (ref 70–99)
Potassium: 4.4 mEq/L (ref 3.5–5.1)
Sodium: 137 mEq/L (ref 135–145)

## 2011-08-23 LAB — CBC
Hemoglobin: 12.3 g/dL — ABNORMAL LOW (ref 13.0–17.0)
MCHC: 34.6 g/dL (ref 30.0–36.0)
RDW: 12.8 % (ref 11.5–15.5)
WBC: 7.3 10*3/uL (ref 4.0–10.5)

## 2011-08-23 MED ORDER — SODIUM CHLORIDE 0.9 % IV BOLUS (SEPSIS)
500.0000 mL | Freq: Once | INTRAVENOUS | Status: AC
  Administered 2011-08-23: 500 mL via INTRAVENOUS

## 2011-08-23 MED ORDER — WARFARIN SODIUM 5 MG PO TABS
5.0000 mg | ORAL_TABLET | Freq: Once | ORAL | Status: AC
  Administered 2011-08-23: 5 mg via ORAL
  Filled 2011-08-23: qty 1

## 2011-08-23 NOTE — Evaluation (Signed)
Occupational Therapy Evaluation and Discharge Patient Details Name: Robert Lester MRN: [id number] DOB: Aug 29, 1950 Today's Date: 08/23/2011  Problem List:  Patient Active Problem List  Diagnoses  . Hip arthritis Right    Past Medical History:  Past Medical History  Diagnosis Date  . Arthritis   . Hypertension     TX DR Doristine Church [facility]   Past Surgical History:  Past Surgical History  Procedure Date  . Inguinal hernia repair     OT Assessment/Plan/Recommendation OT Assessment Clinical Impression Statement: Pt.presents s/p left THA and with increased pain. All education completed with pt. and recommend D/C home with wife assist PRN. OT Recommendation/Assessment: Patient does not need any further OT services OT Recommendation Follow Up Recommendations: No OT follow up Equipment Recommended: None recommended by OT     OT Evaluation Precautions/Restrictions  Precautions Precautions: Posterior Hip Precaution Booklet Issued: Yes (comment) Required Braces or Orthoses: No Restrictions Weight Bearing Restrictions: Yes RLE Weight Bearing: Weight bearing as tolerated Prior Functioning Home Living Lives With: Spouse Receives Help From: Family Type of Home: House Home Layout: One level Home Access: Stairs to enter Entrance Stairs-Rails: Left Entrance Stairs-Number of Steps: 3 Bathroom Shower/Tub: Psychologist, counselling;Door [employer] Toilet: Standard Bathroom Accessibility: Yes How Accessible: Accessible via wheelchair;Accessible via walker Home Adaptive Equipment: Bedside commode/3-in-1;Built-in shower seat;Grab bars around toilet;Grab bars in shower;Hand-held shower hose;Walker - rolling Prior Function Level of Independence: Independent with basic ADLs;Independent with homemaking with ambulation;Independent with gait;Independent with transfers Able to Take Stairs?: Yes Driving: Yes Vocation: Full time employment Vocation Requirements: Engineer, drilling.  Leisure:  Hobbies-yes (Comment) Comments: Motorcycle ADL ADL Eating/Feeding: Performed;Independent Where Assessed - Eating/Feeding: Chair Grooming: Simulated;Wash/dry hands;Wash/dry face;Supervision/safety;Set up Grooming Details (indicate cue type and reason): With RW Where Assessed - Grooming: Standing at sink Upper Body Bathing: Simulated;Chest;Right arm;Left arm;Abdomen;Set up Where Assessed - Upper Body Bathing: Sitting, chair Lower Body Bathing: Simulated;Set up;Supervision/safety Lower Body Bathing Details (indicate cue type and reason): With use of long handled sponge Where Assessed - Lower Body Bathing: Sit to stand from chair Upper Body Dressing: Performed;Set up Upper Body Dressing Details (indicate cue type and reason): With donning gown Where Assessed - Upper Body Dressing: Sitting, chair Lower Body Dressing: Performed;Set up;Supervision/safety Lower Body Dressing Details (indicate cue type and reason): With donning pants  with use of reacher to complete Where Assessed - Lower Body Dressing: Sit to stand from chair Toilet Transfer: Simulated;Supervision/safety Toilet Transfer Method: Proofreader: Raised toilet seat with arms (or 3-in-1 over toilet) Toileting - Clothing Manipulation: Simulated;Set up Where Assessed - Toileting Clothing Manipulation: Sit on 3-in-1 or toilet Toileting - Hygiene: Simulated;Set up Where Assessed - Toileting Hygiene: Sit on 3-in-1 or toilet Equipment Used: Rolling walker Ambulation Related to ADLs: Pt. supervision ~25' ADL Comments: Pt. educated on use of AE for completing LB ADLs while maintaining hip precautions and also educated on safe shower transfer with use of RW for posterior entrance over threshold of shower     Cognition Cognition Arousal/Alertness: Awake/alert Overall Cognitive Status: Appears within functional limits for tasks assessed Orientation Level: Oriented X4    Extremity Assessment RUE Assessment RUE  Assessment: Within Functional Limits LUE Assessment LUE Assessment: Within Functional Limits Mobility  Bed Mobility Bed Mobility: Yes Supine to Sit: 6: Modified independent (Device/Increase time);HOB flat Transfers Sit to Stand: 5: Supervision;From bed;With upper extremity assist Sit to Stand Details (indicate cue type and reason): Supervision for safety & to ensure proper technique.  No cueing needed.   Stand to  Sit: 6: Modified independent (Device/Increase time);With armrests;With upper extremity assist;To chair/3-in-1   End of Session OT - End of Session Equipment Utilized During Treatment: Gait belt Activity Tolerance: Patient tolerated treatment well Patient left: in chair;with call bell in reach Nurse Communication: Mobility status for transfers General Behavior During Session: [facility] for tasks performed Cognition: [facility] for tasks performed   Joaovictor Krone, OTR/L Pager [phone] 08/23/2011, 12:02 PM

## 2011-08-23 NOTE — Progress Notes (Signed)
PT Progress Note:    08/23/11 1100  PT Visit Information  Last PT Received On 08/23/11  Precautions  Precautions Posterior Hip  Required Braces or Orthoses No  Restrictions  RLE Weight Bearing WBAT  Bed Mobility  Bed Mobility Yes  Supine to Sit 6: Modified independent (Device/Increase time);HOB flat  Transfers  Transfers Yes  Sit to Stand 5: Supervision;From bed;With upper extremity assist  Sit to Stand Details (indicate cue type and reason) Supervision for safety & to ensure proper technique.  No cueing needed.    Stand to Sit 6: Modified independent (Device/Increase time);With armrests;With upper extremity assist;To chair/3-in-1  Ambulation/Gait  Ambulation/Gait Yes  Ambulation/Gait Assistance 5: Supervision  Ambulation/Gait Assistance Details (indicate cue type and reason) Supervision for safety.  No cues needed for sequencing.  Did well with step-through gait pattern.  Encouraged pt to decrease reliance of UE's on RW & attempt to tolerate more weight through RLE.    Ambulation Distance (Feet) 150 Feet  Assistive device Rolling walker  Gait Pattern Step-through pattern  Stairs Yes  Stairs Assistance Other (comment) (Min Guard (A))  Stairs Assistance Details (indicate cue type and reason) Cues for sequencing & technique.    Stair Management Technique One rail Left;Step to pattern;Forwards  Number of Stairs 4   Wheelchair Mobility  Wheelchair Mobility No  Posture/Postural Control  Posture/Postural Control No significant limitations  Balance  Balance Assessed No  Exercises  Exercises Total Joint  Total Joint Exercises  Ankle Circles/Pumps AROM;Both;10 reps;Supine  Gluteal Sets AROM;Strengthening;10 reps;Supine  Heel Slides AROM;Strengthening;10 reps;Right;Supine  PT - End of Session  Equipment Utilized During Treatment Gait belt  Activity Tolerance Patient tolerated treatment well  Patient left in chair;with call bell in reach  Nurse Communication Mobility status for  transfers;Mobility status for ambulation  General  Behavior During Session [facility] for tasks performed  Cognition [facility] for tasks performed  PT - Assessment/Plan  Comments on Treatment Session Pt is progressing very well with PT goals.  Increased ambulation distance & performed stairs this session.    PT Plan Discharge plan remains appropriate  PT Frequency 7X/week  Follow Up Recommendations Home health PT  Equipment Recommended None recommended by PT  Acute Rehab PT Goals  PT Goal: Supine/Side to Sit - Progress Progressing toward goal  PT Goal: Ambulate - Progress Progressing toward goal  PT Goal: Up/Down Stairs - Progress Progressing toward goal  PT Goal: Perform Home Exercise Program - Progress Progressing toward goal  Additional Goals  PT Goal: Additional Goal #1 - Progress Progressing toward goal      Robert Lester, [place] [phone] 08/23/2011

## 2011-08-23 NOTE — Progress Notes (Signed)
ANTICOAGULATION CONSULT NOTE - Initial Consult  Pharmacy Consult for Coumadin Indication: VTE prophylaxis s/p R THA  No Known Allergies  Patient Measurements: Ht: 70 inches Wt: 89.8 kg   Vital Signs: Temp: 98.5 F (36.9 C) (04/09 0604) BP: 110/60 mmHg (04/09 0624) Pulse Rate: 62  (04/09 0604)  Labs:  Basename 08/23/11 0540  HGB 12.3*  HCT 35.6*  PLT 128*  APTT --  LABPROT 17.0*  INR 1.36  HEPARINUNFRC --  CREATININE 1.10  CKTOTAL --  CKMB --  TROPONINI --   CrCl is unknown because there is no height on file for the current visit.  Medical History: Past Medical History  Diagnosis Date  . Arthritis   . Hypertension     TX DR Doristine [facility]    Medications:  Prescriptions prior to admission  Medication Sig Dispense Refill  . busPIRone (BUSPAR) 15 MG tablet Take 15 mg by mouth 2 (two) times daily.      . celecoxib (CELEBREX) 200 MG capsule Take 200 mg by mouth 2 (two) times daily.      . ciprofloxacin (CIPRO) 500 MG tablet Take 500 mg by mouth 2 (two) times daily.      . citalopram (CELEXA) 20 MG tablet Take 20 mg by mouth daily.      Marland Kitchen HYDROcodone-acetaminophen (NORCO) 5-325 MG per tablet Take 1 tablet by mouth every 6 (six) hours as needed. For pain      . HYDROcodone-acetaminophen (VICODIN) 5-500 MG per tablet Take 1 tablet by mouth every 6 (six) hours as needed. For pain      . lisinopril (PRINIVIL,ZESTRIL) 10 MG tablet Take 10 mg by mouth daily.      Marland Kitchen lovastatin (MEVACOR) 20 MG tablet Take 20 mg by mouth at bedtime.      . metoprolol succinate (TOPROL-XL) 25 MG 24 hr tablet Take 25 mg by mouth daily.      Marland Kitchen zolpidem (AMBIEN) 10 MG tablet Take 10 mg by mouth at bedtime as needed. For sleep        Assessment: 61 y/o male receiving coumadin for VTE prophylaxis s/p R THA 4/8. INR inc to 1.36 from baseline after 1 dose and CBC stable. Will decrease dose today.  Goal of Therapy:  INR 2-3   Plan:  1. Coumadin 5 mg po today 2. Coumadin  educational booklet and video 3. Daily PT/INR  Verlene Mayer, PharmD, BCPS Pager [phone] 08/23/2011,10:13 AM

## 2011-08-23 NOTE — Progress Notes (Signed)
PT Progress Note:     08/23/11 1400  PT Visit Information  Last PT Received On 08/23/11  Precautions  Precautions Posterior Hip  Required Braces or Orthoses No  Restrictions  RLE Weight Bearing WBAT  Bed Mobility  Bed Mobility No  Transfers  Transfers Yes  Sit to Stand 6: Modified independent (Device/Increase time);From chair/3-in-1;With armrests;With upper extremity assist  Stand to Sit 6: Modified independent (Device/Increase time);With armrests;To chair/3-in-1;With upper extremity assist  Ambulation/Gait  Ambulation/Gait Yes  Ambulation/Gait Assistance 5: Supervision  Ambulation/Gait Assistance Details (indicate cue type and reason) Supervision for safety due to Low BP (pt asymptomatic & RN stated it would be ok to work with PT with caution)  Ambulation Distance (Feet) 200 Feet  Assistive device Rolling walker  Gait Pattern Step-through pattern  Stairs No  Posture/Postural Control  Posture/Postural Control No significant limitations  Balance  Balance Assessed No  Exercises  Exercises Total Joint  Total Joint Exercises  Hip ABduction/ADduction AROM;Strengthening;5 reps;Right;Standing  Long Arc Quad AROM;Strengthening;Right;10 reps;Seated  Marching in Standing AROM;Strengthening;Right;10 reps;Standing;Seated  Standing Hip Extension AROM;Strengthening;Right;5 reps;Standing  PT - End of Session  Equipment Utilized During Treatment Gait belt  Activity Tolerance Patient tolerated treatment well  Patient left in chair;with call bell in reach  Nurse Communication Mobility status for transfers;Mobility status for ambulation  General  Behavior During Session [facility] for tasks performed  Cognition [facility] for tasks performed  PT - Assessment/Plan  Comments on Treatment Session Continues to progress with PT goals.  Very motivated.    PT Plan Discharge plan remains appropriate  PT Frequency 7X/week  Follow Up Recommendations Home health PT  Equipment Recommended None recommended by PT    Acute Rehab PT Goals  PT Goal: Ambulate - Progress Progressing toward goal  PT Goal: Perform Home Exercise Program - Progress Progressing toward goal  Additional Goals  PT Goal: Additional Goal #1 - Progress Met      Verdell Face, PTA [phone] 08/23/2011

## 2011-08-23 NOTE — Progress Notes (Signed)
Patient ID: Robert Lester, male   DOB: 21-Jun-1950, 61 y.o.   MRN: [id number] PATIENT ID: Robert Lester  MRN: [id number]  DOB/AGE:  Aug 27, 1950 / 61 y.o.  1 Day Post-Op Procedure(s) (LRB): TOTAL HIP ARTHROPLASTY (Right)    PROGRESS NOTE Subjective: Patient is alert, oriented,noNausea, no Vomiting, yes passing gas, no Bowel Movement. Taking PO well. Denies SOB, Chest or Calf Pain. Using Incentive Spirometer, PAS in place. Ambulate wbat Patient reports pain as 2 on 0-10 scale  .    Objective: Vital signs in last 24 hours: Filed Vitals:   08/23/11 0041 08/23/11 0216 08/23/11 0604 08/23/11 0624  BP: 96/68 93/57 90/60  110/60  Pulse:  62 62   Temp: 98.5 F (36.9 C) 98.6 F (37 C) 98.5 F (36.9 C)   TempSrc:      Resp:  18 18   SpO2:  95% 97%       Intake/Output from previous day: I/O last 3 completed shifts: In: 4146.3 [P.O.:240; I.V.:3906.3] Out: 4600 [Urine:4500; Blood:100]   Intake/Output this shift:     LABORATORY DATA:  Basename 08/23/11 0540  WBC 7.3  HGB 12.3*  HCT 35.6*  PLT 128*  NA --  K --  CL --  CO2 --  BUN --  CREATININE --  GLUCOSE --  GLUCAP --  INR 1.36  CALCIUM --    Examination: Neurologically intact ABD soft Neurovascular intact Sensation intact distally Intact pulses distally Dorsiflexion/Plantar flexion intact Incision: dressing C/D/I No cellulitis present Compartment soft} XR AP&Lat of hip shows well placed\fixed THA  Assessment:   1 Day Post-Op Procedure(s) (LRB): TOTAL HIP ARTHROPLASTY (Right) ADDITIONAL DIAGNOSIS:    Plan: PT/OT WBAT, THA  posterior precautions  DVT Prophylaxis: SCDx72hr\Coumadin x 2weeks, monitor INR 1.5-2.0 target  DISCHARGE PLAN: Home  DISCHARGE NEEDS: HHPT, HHRN, CPM, Walker and 3-in-1 comode seat

## 2011-08-24 LAB — PROTIME-INR: INR: 1.78 — ABNORMAL HIGH (ref 0.00–1.49)

## 2011-08-24 LAB — CBC
Platelets: 126 10*3/uL — ABNORMAL LOW (ref 150–400)
RDW: 12.9 % (ref 11.5–15.5)
WBC: 8.1 10*3/uL (ref 4.0–10.5)

## 2011-08-24 MED ORDER — METHOCARBAMOL 500 MG PO TABS: 500.0000 mg | ORAL_TABLET | Freq: Four times a day (QID) | ORAL | Status: AC | PRN

## 2011-08-24 MED ORDER — OXYCODONE-ACETAMINOPHEN 5-325 MG PO TABS: 1.0000 | ORAL_TABLET | ORAL | Status: AC | PRN

## 2011-08-24 MED ORDER — WARFARIN SODIUM 5 MG PO TABS: 5.0000 mg | ORAL_TABLET | Freq: Every day | ORAL | Status: DC

## 2011-08-24 NOTE — Discharge Summary (Signed)
Patient ID: Othniel Maret MRN: [id number] DOB/AGE: 60-Mar-1952 60 y.o.  Admit date: 08/22/2011 Discharge date: 08/24/2011  Admission Diagnoses:  Principal Problem:  *Hip arthritis Right   Discharge Diagnoses:  Same  Past Medical History  Diagnosis Date  . Arthritis   . Hypertension     TX DR [facility] Pl[facility]    Surgeries: Procedure(s): TOTAL HIP ARTHROPLASTY on 08/22/2011   Consultants:    Discharged Condition: Improved  Hospital Course: Robert Lester is an 61 y.o. male who was admitted 08/22/2011 for operative treatment ofHip arthritis. Patient has severe unremitting pain that affects sleep, daily activities, and work/hobbies. After pre-op clearance the patient was taken to the operating room on 08/22/2011 and underwent  Procedure(s): TOTAL HIP ARTHROPLASTY.    Patient was given perioperative antibiotics: Anti-infectives     Start     Dose/Rate Route Frequency Ordered Stop   08/21/11 1353   ceFAZolin (ANCEF) IVPB 2 g/50 mL premix        2 g 100 mL/hr over 30 Minutes Intravenous 60 min pre-op 08/21/11 1353 08/22/11 0729           Patient was given sequential compression devices, early ambulation, and chemoprophylaxis to prevent DVT.  Patient benefited maximally from hospital stay and there were no complications.    Recent vital signs: Patient Vitals for the past 24 hrs:  BP Temp Pulse Resp SpO2  08/24/11 0541 99/63 mmHg 98.4 F (36.9 C) 63  18  98 %  09/10/2011 2217 113/70 mmHg 98.8 F (37.1 C) 67  19  99 %  09/10/2011 1527 98/45 mmHg 98.4 F (36.9 C) 65  18  98 %  09-10-11 1155 76/46 mmHg - 62  - -  2011-09-10 1000 76/45 mmHg - 62  - -     Recent laboratory studies:  Basename 08/24/11 0628 September 10, 2011 0540  WBC 8.1 7.3  HGB 11.8* 12.3*  HCT 34.5* 35.6*  PLT 126* 128*  NA -- 137  K -- 4.4  CL -- 103  CO2 -- 26  BUN -- 19  CREATININE -- 1.10  GLUCOSE -- 142*  INR 1.78* 1.36  CALCIUM -- 8.3*     Discharge Medications:   Medication List  As of  08/24/2011  8:16 AM   STOP taking these medications         ciprofloxacin 500 MG tablet      HYDROcodone-acetaminophen 5-325 MG per tablet      HYDROcodone-acetaminophen 5-500 MG per tablet         TAKE these medications         busPIRone 15 MG tablet   Commonly known as: BUSPAR   Take 15 mg by mouth 2 (two) times daily.      celecoxib 200 MG capsule   Commonly known as: CELEBREX   Take 200 mg by mouth 2 (two) times daily.      citalopram 20 MG tablet   Commonly known as: CELEXA   Take 20 mg by mouth daily.      lisinopril 10 MG tablet   Commonly known as: PRINIVIL,ZESTRIL   Take 10 mg by mouth daily.      lovastatin 20 MG tablet   Commonly known as: MEVACOR   Take 20 mg by mouth at bedtime.      methocarbamol 500 MG tablet   Commonly known as: ROBAXIN   Take 1 tablet (500 mg total) by mouth every 6 (six) hours as needed.      metoprolol succinate 25  MG 24 hr tablet   Commonly known as: TOPROL-XL   Take 25 mg by mouth daily.      oxyCODONE-acetaminophen 5-325 MG per tablet   Commonly known as: PERCOCET   Take 1-2 tablets by mouth every 4 (four) hours as needed.      warfarin 5 MG tablet   Commonly known as: COUMADIN   Take 1 tablet (5 mg total) by mouth daily.      zolpidem 10 MG tablet   Commonly known as: AMBIEN   Take 10 mg by mouth at bedtime as needed. For sleep            Diagnostic Studies: Dg Chest 2 View  08/18/2011  *RADIOLOGY REPORT*  Clinical Data: Preop examination (right total hip replacement), prior smoker  CHEST - 2 VIEW  Comparison: None.  Findings: Normal cardiac silhouette and mediastinal contours.  No focal parenchymal opacities.  No pleural effusion or pneumothorax. No acute osseous abnormalities.  IMPRESSION: No acute cardiopulmonary disease.  Original Report Authenticated By: Waynard Reeds, M.D.   Dg Pelvis Portable  08/22/2011  *RADIOLOGY REPORT*  Clinical Data: Postop right total hip arthroplasty.  PORTABLE PELVIS  Comparison:  None.  Findings: Patient is status post right total hip arthroplasty. Hardware appears well positioned.  There is no evidence of acute fracture or dislocation.  Mild soft tissue emphysema is noted lateral to the right greater trochanter.  The left hip joint appears unremarkable.  IMPRESSION: No demonstrated complication following right total hip arthroplasty.  Original Report Authenticated By: Gerrianne Scale, M.D.   Dg Hip Portable 1 View Right  08/22/2011  *RADIOLOGY REPORT*  Clinical Data: Postop right total hip arthroplasty  PORTABLE RIGHT HIP - 1 VIEW  Comparison: None.  Findings: Right total hip arthroplasty appears well positioned. There is no evidence of acute fracture or dislocation.  IMPRESSION: No demonstrated complication following right total hip arthroplasty.  Original Report Authenticated By: Gerrianne Scale, M.D.    Disposition: Final discharge disposition not confirmed  Discharge Orders    Future Orders Please Complete By Expires   Increase activity slowly      Walker       May shower / Bathe      Driving Restrictions      Comments:   No driving for 2 weeks.   Change dressing (specify)      Comments:   Dressing change as needed.   Call MD for:  temperature >100.4      Call MD for:  severe uncontrolled pain      Call MD for:  redness, tenderness, or signs of infection (pain, swelling, redness, odor or green/yellow discharge around incision site)      Discharge instructions      Comments:   F/U with Dr. Turner Daniels in 10-12 days         Signed: Hazle Nordmann. 08/24/2011, 8:16 AM

## 2011-08-24 NOTE — Progress Notes (Signed)
UR COMPLETED  

## 2011-08-24 NOTE — Progress Notes (Signed)
PATIENT ID: Robert Lester  MRN: [id number]  DOB/AGE:  09-18-1950 / 60 y.o.  2 Days Post-Op Procedure(s) (LRB): TOTAL HIP ARTHROPLASTY (Right)    PROGRESS NOTE Subjective: Patient is alert, oriented,noNausea, no Vomiting, yes passing gas, no Bowel Movement. Taking PO well. Denies SOB, Chest or Calf Pain. Using Incentive Spirometer, PAS in place. Ambulating well with PT Patient reports pain as moderate  .    Objective: Vital signs in last 24 hours: Filed Vitals:   08/23/11 1155 08/23/11 1527 08/23/11 2217 08/24/11 0541  BP: 76/46 98/45 113/70 99/63  Pulse: 62 65 67 63  Temp:  98.4 F (36.9 C) 98.8 F (37.1 C) 98.4 F (36.9 C)  TempSrc:      Resp:  18 19 18   SpO2:  98% 99% 98%      Intake/Output from previous day: I/O last 3 completed shifts: In: 4365 [P.O.:240; I.V.:4125] Out: 3550 [Urine:3550]   Intake/Output this shift:     LABORATORY DATA:  Basename 08/24/11 0628 08/23/11 0540  WBC 8.1 7.3  HGB 11.8* 12.3*  HCT 34.5* 35.6*  PLT 126* 128*  NA -- 137  K -- 4.4  CL -- 103  CO2 -- 26  BUN -- 19  CREATININE -- 1.10  GLUCOSE -- 142*  GLUCAP -- --  INR 1.78* 1.36  CALCIUM -- 8.3*    Examination: Neurologically intact ABD soft Neurovascular intact Sensation intact distally Intact pulses distally Dorsiflexion/Plantar flexion intact Incision: moderate drainage} XR AP&Lat of hip shows well placed\fixed THA  Assessment:   2 Days Post-Op Procedure(s) (LRB): TOTAL HIP ARTHROPLASTY (Right) ADDITIONAL DIAGNOSIS:  none  Plan: PT/OT WBAT, THA  posterior precautions  DVT Prophylaxis: SCDx72hr\Coumadin x 2weeks, monitor INR 1.5-2.0 target  DISCHARGE PLAN: Home today  DISCHARGE NEEDS: HHPT, HHRN, Walker and 3-in-1 comode seat

## 2011-08-24 NOTE — Progress Notes (Signed)
PT Progress Note:     08/24/11 1100  PT Visit Information  Last PT Received On 08/24/11  Precautions  Precautions Posterior Hip  Required Braces or Orthoses No  Restrictions  RLE Weight Bearing WBAT  Bed Mobility  Bed Mobility Yes  Supine to Sit 6: Modified independent (Device/Increase time)  Sit to Supine 4: Min assist  Sit to Supine - Details (indicate cue type and reason) (A) to lift RLE onto bed  Transfers  Transfers Yes  Sit to Stand 6: Modified independent (Device/Increase time);From bed;From chair/3-in-1;With armrests;With upper extremity assist  Stand to Sit 6: Modified independent (Device/Increase time);With upper extremity assist;To chair/3-in-1;To bed;With armrests  Ambulation/Gait  Ambulation/Gait Yes  Ambulation/Gait Assistance 5: Supervision  Ambulation/Gait Assistance Details (indicate cue type and reason) Cues for smoothness with turns- slow down.    Ambulation Distance (Feet) 200 Feet  Assistive device Rolling walker  Gait Pattern Step-through pattern  Stairs Yes  Stairs Assistance 6: Modified independent (Device/Increase time)  Stair Management Technique One rail Left;Step to pattern;Forwards  Number of Stairs 5   Wheelchair Mobility  Wheelchair Mobility No  Posture/Postural Control  Posture/Postural Control No significant limitations  Balance  Balance Assessed No  Exercises  Exercises Total Joint  Total Joint Exercises  Ankle Circles/Pumps AROM;15 reps;Supine;Both  Gluteal Sets AROM;Strengthening;10 reps;Supine  Heel Slides AROM;Strengthening;Right;15 reps;Supine  Hip ABduction/ADduction AROM;Strengthening;15 reps;Supine  Straight Leg Raises Strengthening;AAROM;Right;15 reps;Supine  Long [employer] AROM;Strengthening;Right;15 reps;Seated  PT - End of Session  Equipment Utilized During Treatment Gait belt  Activity Tolerance Patient tolerated treatment well  Patient left in chair;with call bell in reach  Nurse Communication Mobility status for  transfers;Mobility status for ambulation  General  Behavior During Session [facility] for tasks performed  Cognition [facility] for tasks performed  PT - Assessment/Plan  Comments on Treatment Session Doing very well.  Mobilizes well enough to d/c home today.   PT Plan Discharge plan remains appropriate  PT Frequency 7X/week  Follow Up Recommendations Home health PT  Equipment Recommended None recommended by PT  Acute Rehab PT Goals  PT Goal: Supine/Side to Sit - Progress Progressing toward goal  PT Goal: Sit to Supine/Side - Progress Progressing toward goal  PT Goal: Ambulate - Progress Not met  PT Goal: Up/Down Stairs - Progress Met  PT Goal: Perform Home Exercise Program - Progress Progressing toward goal  Additional Goals  PT Goal: Additional Goal #1 - Progress Met       Verdell Face, PTA [phone] 08/24/2011

## 2012-09-22 IMAGING — CR DG CHEST 2V
2 series · 2 of 2 positions shown · non-contrast
Comparison: None.

CLINICAL DATA: Preop examination (right total hip replacement),
prior smoker

CHEST - 2 VIEW

[view not recorded (1 of 2)]
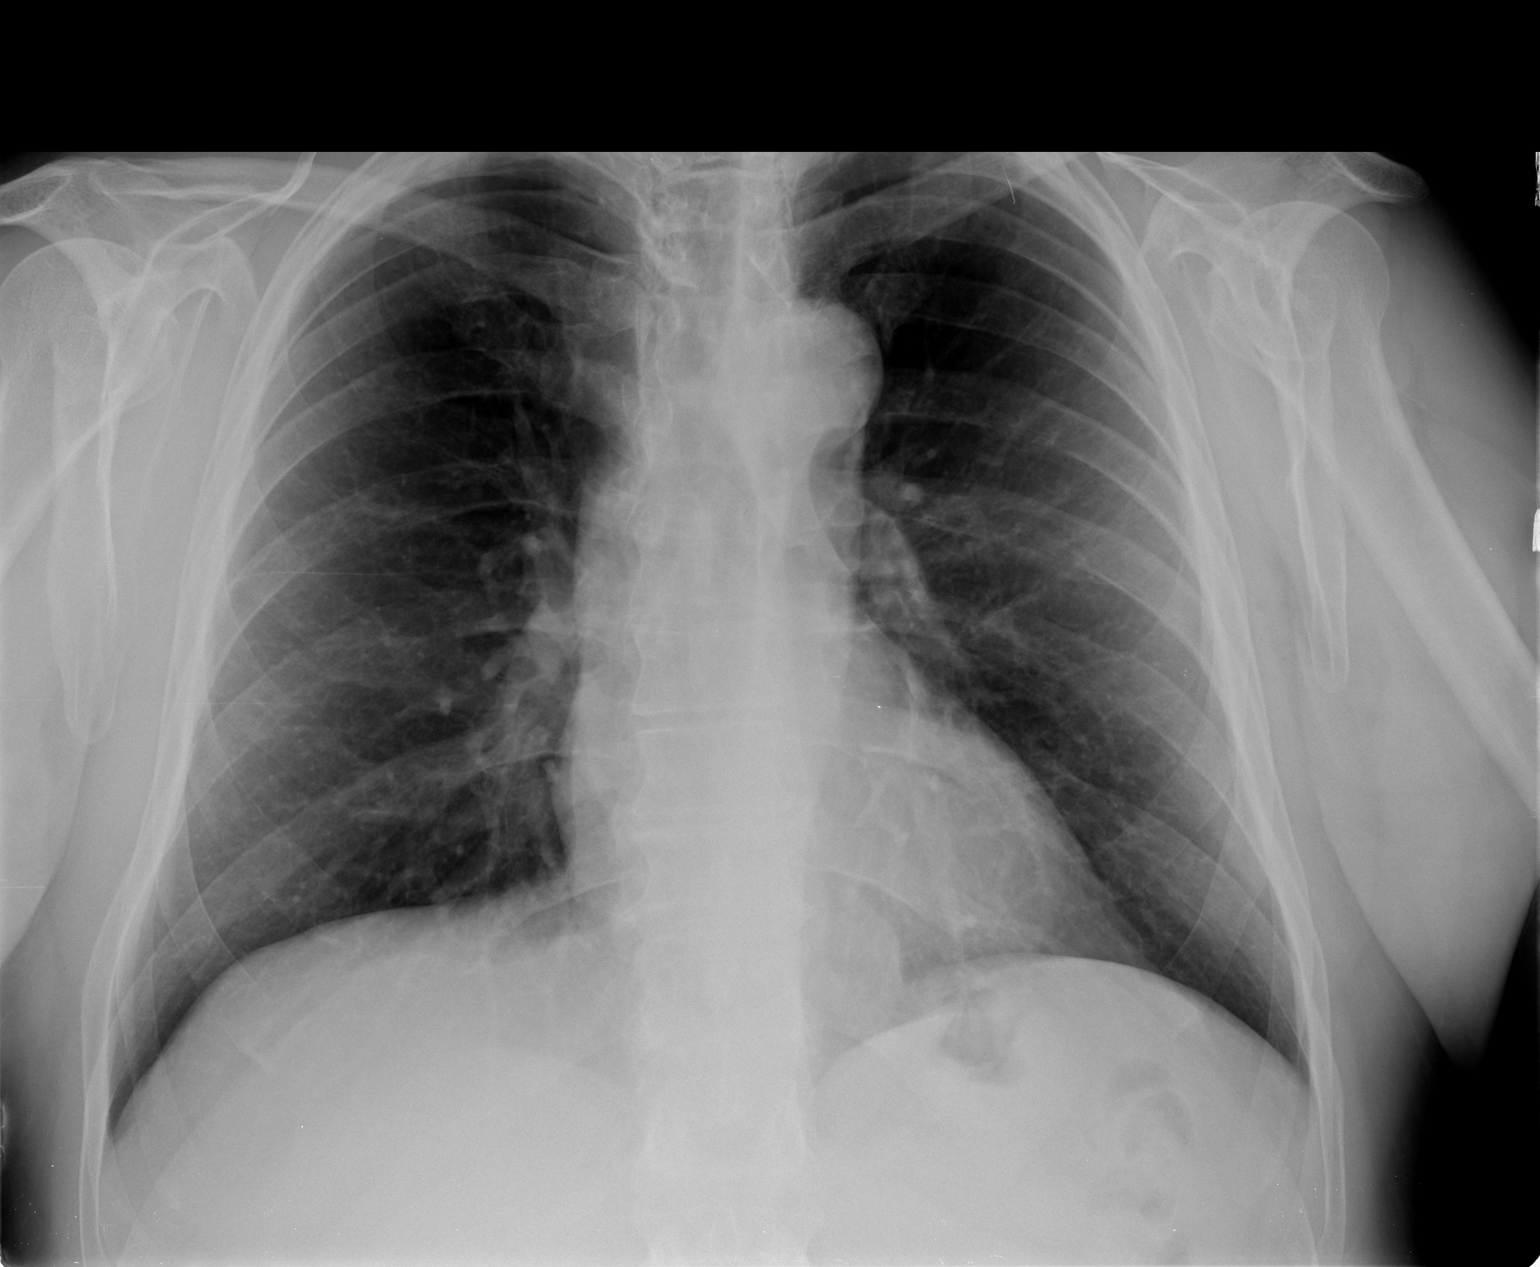

[view not recorded (2 of 2)]
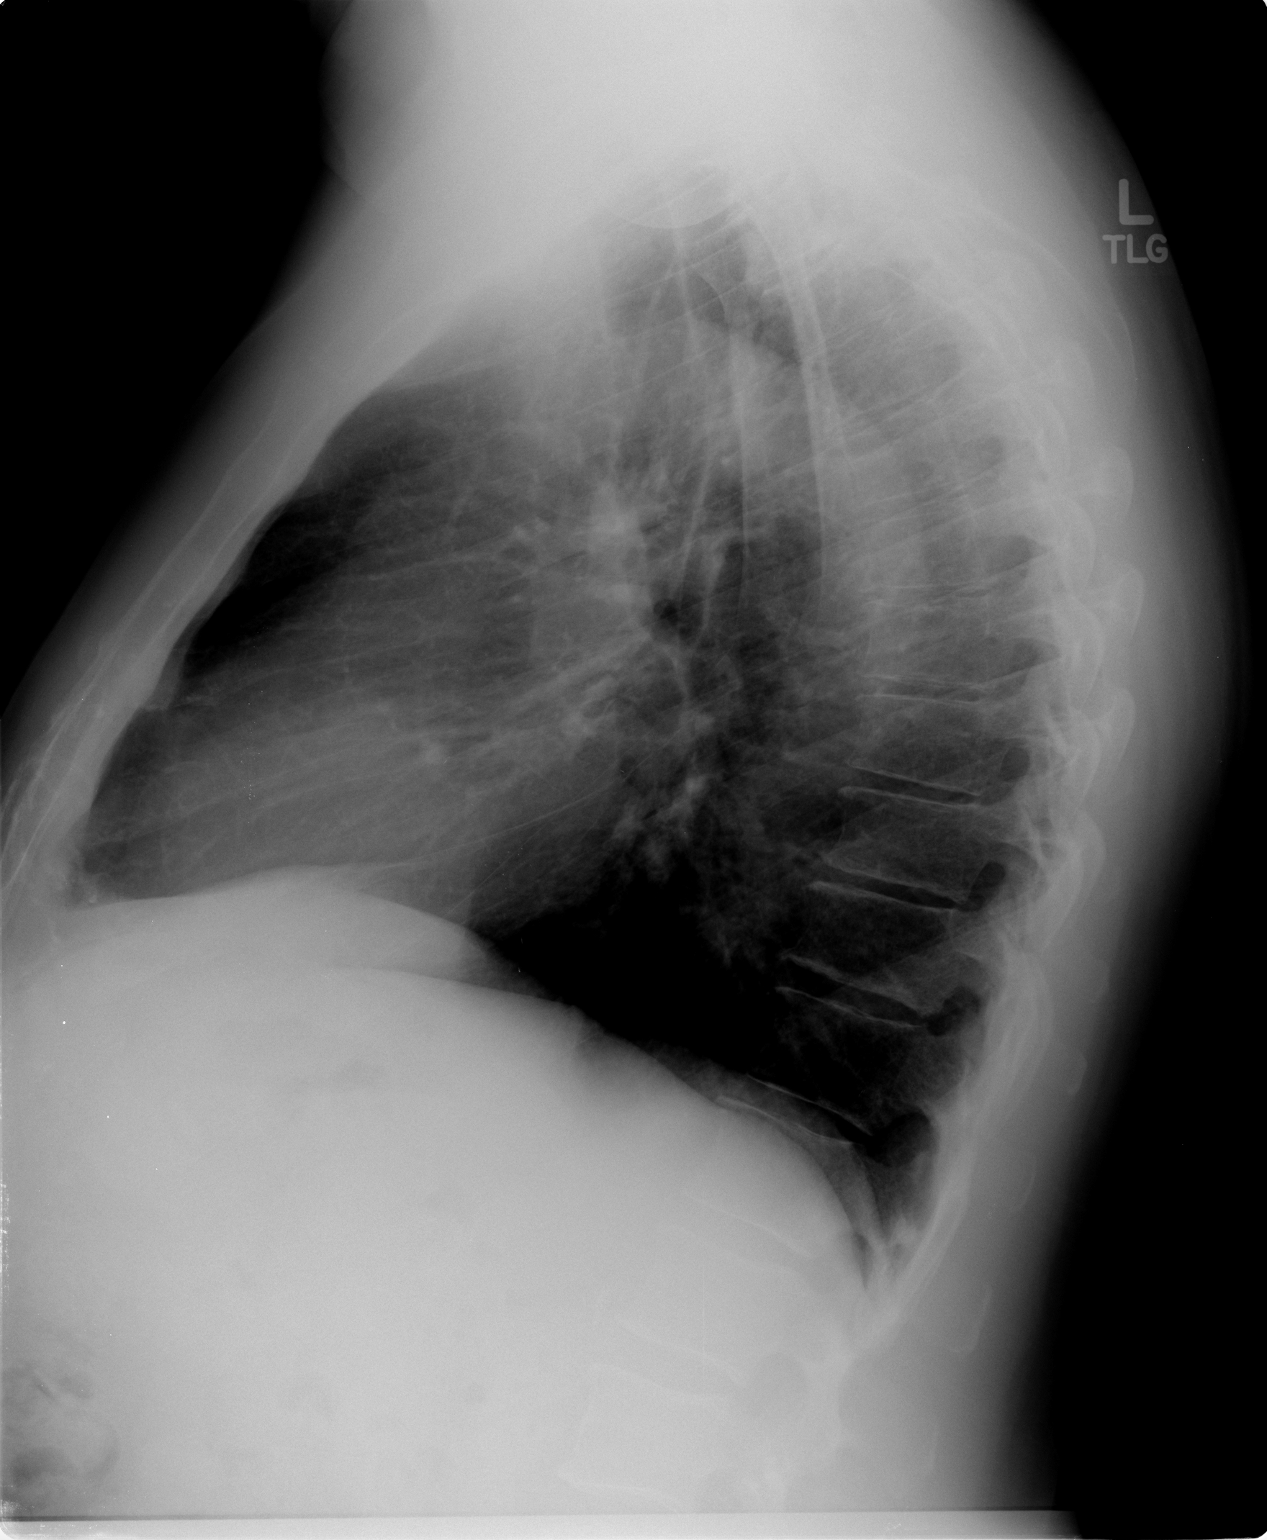

[2 of 2 positions shown; findings below may reference images not displayed]

FINDINGS: Normal cardiac silhouette and mediastinal contours.  No
focal parenchymal opacities.  No pleural effusion or pneumothorax.
No acute osseous abnormalities.
IMPRESSION: No acute cardiopulmonary disease.

## 2014-06-20 ENCOUNTER — Ambulatory Visit: Payer: Self-pay | Admitting: Oncology

## 2014-06-20 ENCOUNTER — Ambulatory Visit: Payer: Self-pay | Admitting: Internal Medicine

## 2014-06-23 ENCOUNTER — Ambulatory Visit: Payer: Self-pay | Admitting: Oncology

## 2014-07-15 ENCOUNTER — Ambulatory Visit
Admit: 2014-07-15 | Disposition: A | Discharge status: Home / Self Care | Payer: Self-pay | Attending: Oncology | Admitting: Oncology

## 2014-09-08 ENCOUNTER — Ambulatory Visit
Admit: 2014-09-08 | Disposition: A | Discharge status: Home / Self Care | Payer: Self-pay | Attending: Internal Medicine | Admitting: Internal Medicine

## 2015-07-26 IMAGING — CT CT ABD-PELV W/ CM
2 of 5 series · 15 of 46 positions shown, 17 images · IV contrast (omnipaque)
Comparison: None.

ADDENDUM:
Study discussed by telephone with Dr. JARRAY FOUCHA on 06/20/2014 at
[DATE] . He advises a highly elevated PSA level in this patient and
clinical suspicion of metastatic prostate cancer.
CLINICAL DATA: 63-year-old male with lower abdominal pain and
testicular pressure for several weeks. Initial encounter.

EXAM:
CT ABDOMEN AND PELVIS WITH CONTRAST
TECHNIQUE: Multidetector CT imaging of the abdomen and pelvis was performed
using the standard protocol following bolus administration of
intravenous contrast.
CONTRAST:  100 mL Omnipaque 300.

[Series 2: routine abd pel with · axial · 0.85mm/px · z∈[-646,-226]mm · 12 of 100 slices shown, 14 images]
[im 8/100  soft-tissue]
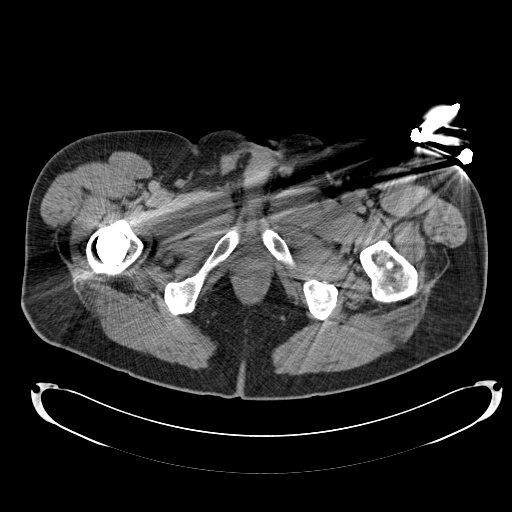
[im 8/100  bone]
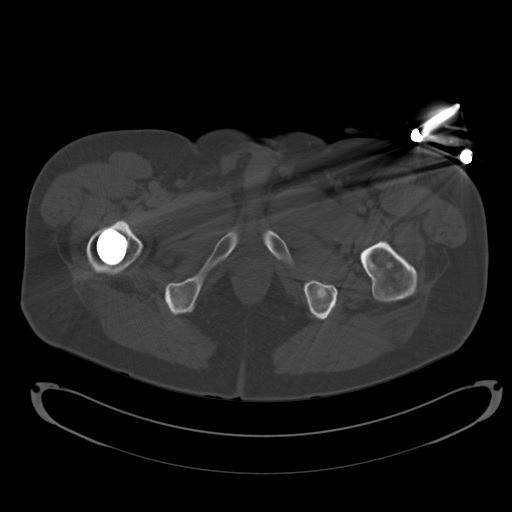
[im 15/100  soft-tissue]
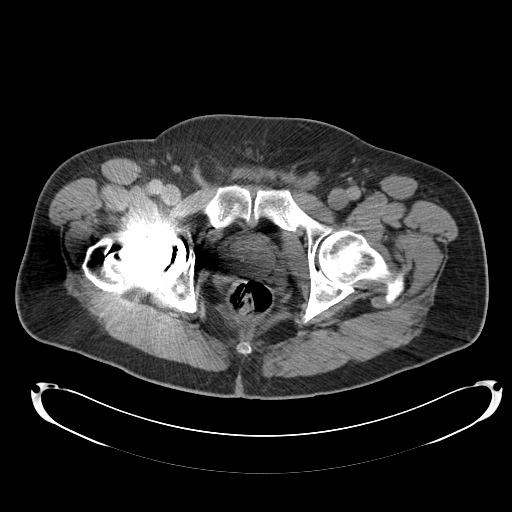
[im 22/100  soft-tissue]
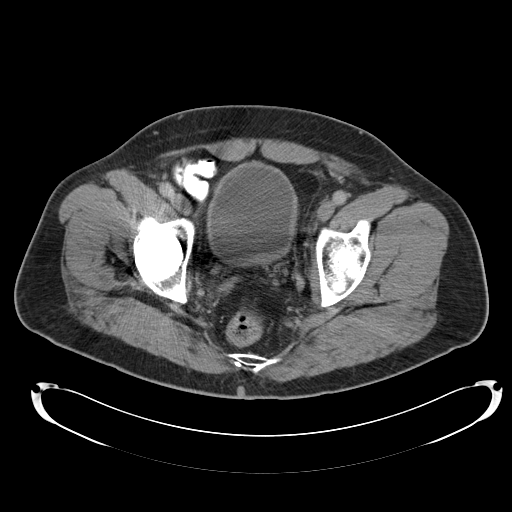
[im 29/100  soft-tissue]
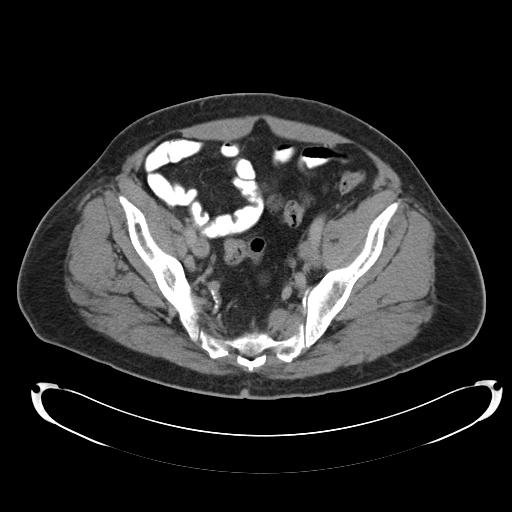
[im 36/100  soft-tissue]
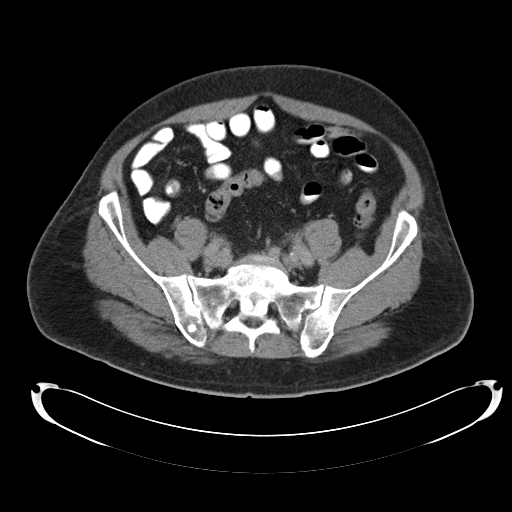
[im 43/100  soft-tissue]
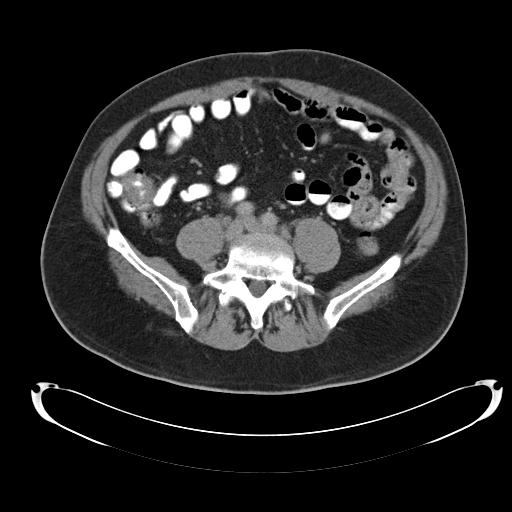
[im 57/100  soft-tissue]
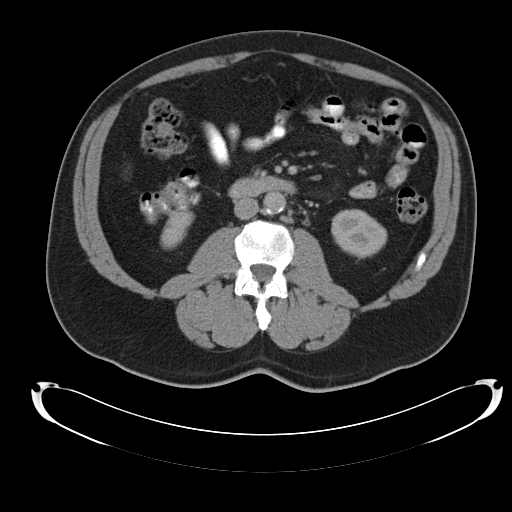
[im 64/100  soft-tissue]
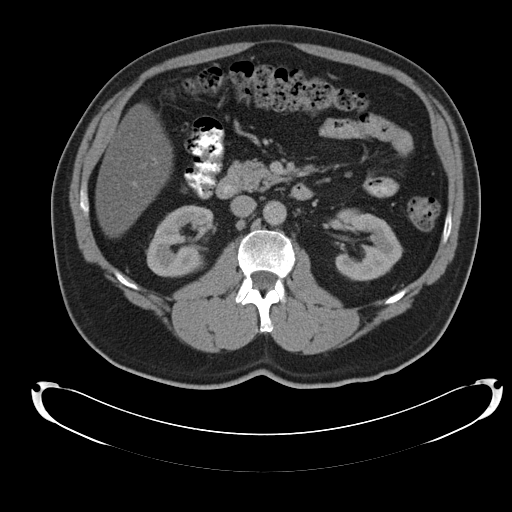
[im 71/100  soft-tissue]
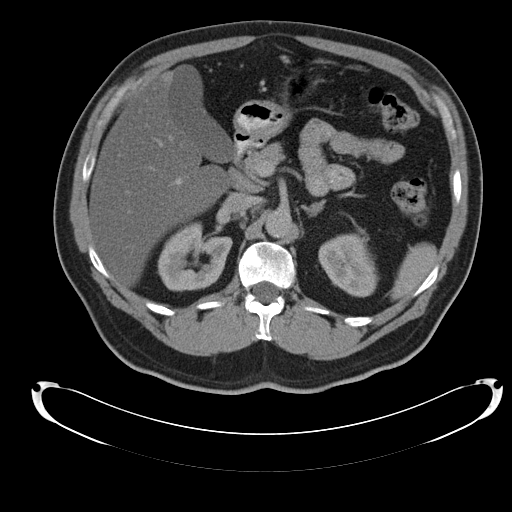
[im 71/100  bone]
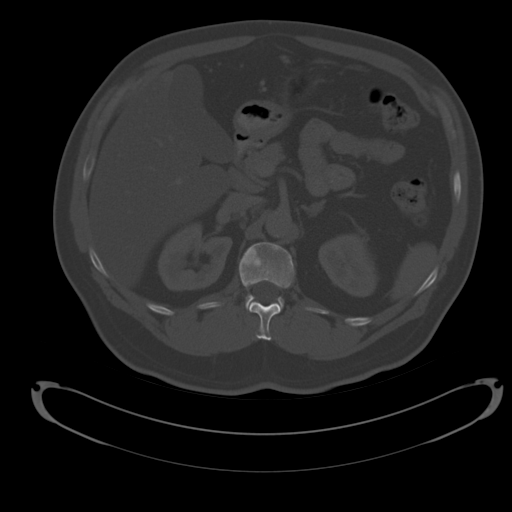
[im 78/100  soft-tissue]
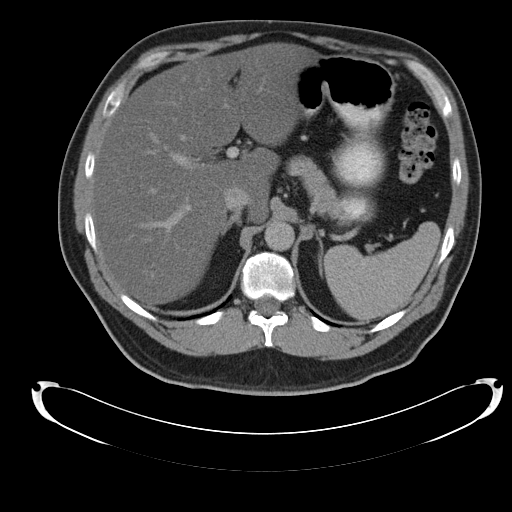
[im 85/100  soft-tissue]
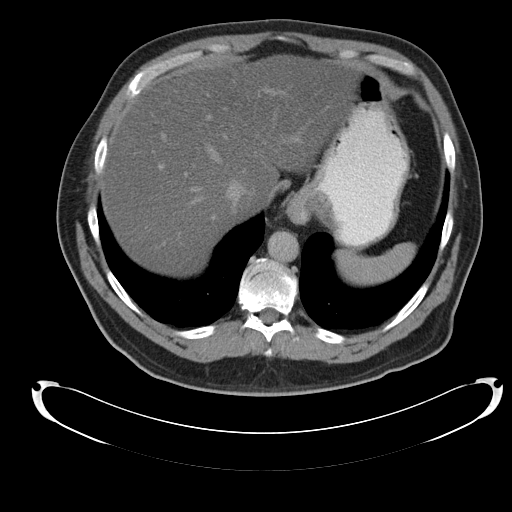
[im 92/100  soft-tissue]
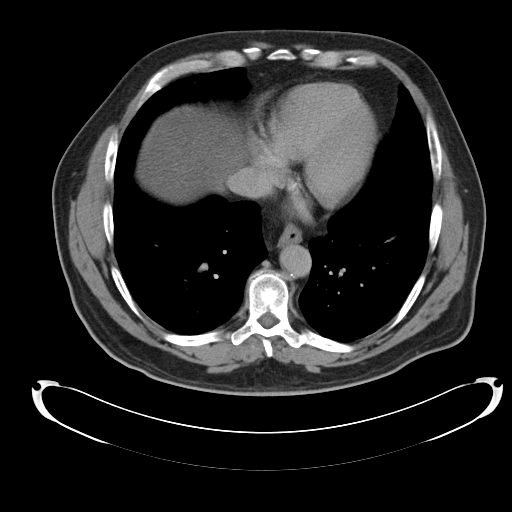

[Series 5: cor routine abd pel with · coronal · 0.78mm/px · 3 of 180 slices shown]
[im 60/180  soft-tissue]
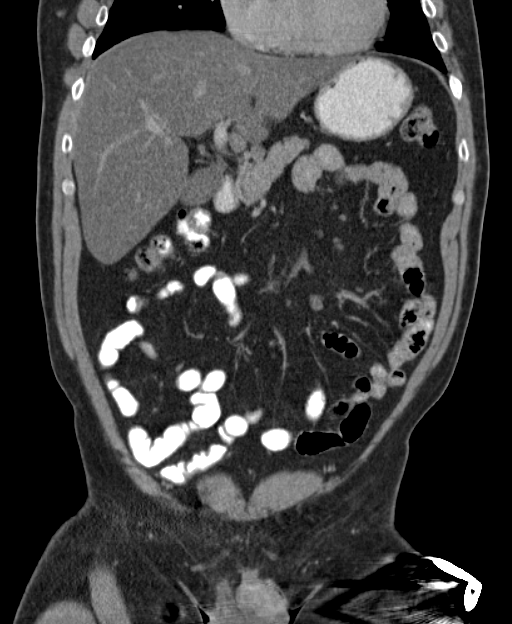
[im 80/180  soft-tissue]
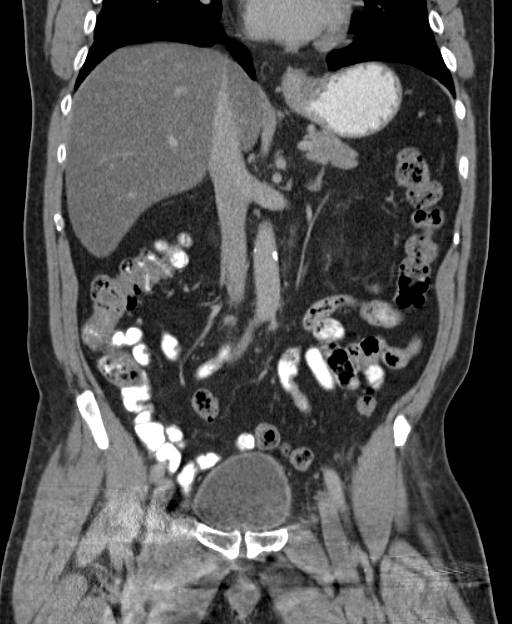
[im 100/180  soft-tissue]
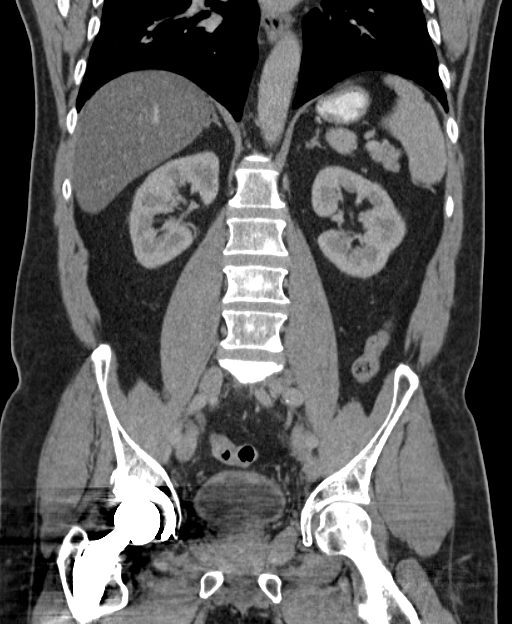

[15 of 46 positions shown; findings below may reference images not displayed]

FINDINGS: Mild dependent atelectasis.  No pericardial or pleural effusion.

Numerous abnormal sclerotic bony lesions demonstrated throughout the
spine, lower ribs, lower sternum (series 4, image 4), pelvis, and
proximal left femur. No associated pathologic fracture is
identified.

No pelvic free fluid. Previous right hip arthroplasties such that
there is streak artifact throughout much of the pelvis. Unremarkable
bladder.

Prostate hypertrophy without aggressive prostate mass evident.
However, there is bulky left pelvic sidewall lymphadenopathy,
affecting the external and internal iliac nodal stations. Individual
nodes measure up to 17 mm in short axis. Adenopathy continues to the
left retroperitoneum at the aortoiliac bifurcation. Higher
retroperitoneal lymph nodes just below the kidneys measure at the
upper limits of normal (9 mm series 2, image 46). No right pelvic
sidewall lymphadenopathy. No inguinal lymphadenopathy. The entire
scrotum is included on delayed phase images and appears within
normal limits.

Negative distal colon. Negative sigmoid colon, left colon,
transverse colon and right colon (presumed retained
stool/peristalsis on series 2, images 53 through 59). Oral contrast
has reached the hepatic flexure. Normal appendix. No dilated small
bowel. Negative stomach and duodenum.

Moderate decreased density throughout the liver in keeping with
steatosis. No suspicious liver lesion identified.

Gallbladder, spleen, pancreas, and adrenal glands are within normal
limits. Portal venous system is patent. Aortoiliac calcified
atherosclerosis noted. Major arterial structures in the abdomen and
pelvis are patent. No abdominal free fluid. Kidneys are within
normal limits. Ureters are non obstructed. Renal contrast excretion
is within normal limits.
IMPRESSION: 1. Widespread sclerotic metastases to bone. Left pelvic sidewall and
lower abdominal retroperitoneal space lymphadenopathy. Constellation
favors metastatic prostate cancer.
2. No pathologic fracture identified. Moderate hepatic steatosis
with no liver metastasis identified.

## 2016-11-19 ENCOUNTER — Emergency Department: Payer: Medicare Other

## 2016-11-19 ENCOUNTER — Emergency Department
Admission: EM | Admit: 2016-11-19 | Discharge: 2016-11-19 | Disposition: A | Discharge status: Home / Self Care | Payer: Medicare Other | Attending: Emergency Medicine | Admitting: Emergency Medicine

## 2016-11-19 ENCOUNTER — Encounter: Payer: Self-pay | Admitting: Emergency Medicine

## 2016-11-19 DIAGNOSIS — R1031 Right lower quadrant pain: Secondary | ICD-10-CM | POA: Insufficient documentation

## 2016-11-19 DIAGNOSIS — I10 Essential (primary) hypertension: Secondary | ICD-10-CM | POA: Diagnosis not present

## 2016-11-19 DIAGNOSIS — Z7901 Long term (current) use of anticoagulants: Secondary | ICD-10-CM | POA: Insufficient documentation

## 2016-11-19 DIAGNOSIS — Z791 Long term (current) use of non-steroidal anti-inflammatories (NSAID): Secondary | ICD-10-CM | POA: Insufficient documentation

## 2016-11-19 DIAGNOSIS — N200 Calculus of kidney: Secondary | ICD-10-CM

## 2016-11-19 DIAGNOSIS — F17290 Nicotine dependence, other tobacco product, uncomplicated: Secondary | ICD-10-CM | POA: Diagnosis not present

## 2016-11-19 DIAGNOSIS — Z79899 Other long term (current) drug therapy: Secondary | ICD-10-CM | POA: Insufficient documentation

## 2016-11-19 HISTORY — DX: Malignant (primary) neoplasm, unspecified: C80.1

## 2016-11-19 LAB — CBC
HEMATOCRIT: 39.5 % — AB (ref 40.0–52.0)
Hemoglobin: 14.1 g/dL (ref 13.0–18.0)
MCH: 36.8 pg — ABNORMAL HIGH (ref 26.0–34.0)
MCHC: 35.6 g/dL (ref 32.0–36.0)
MCV: 103.4 fL — ABNORMAL HIGH (ref 80.0–100.0)
Platelets: 279 10*3/uL (ref 150–440)
RBC: 3.82 MIL/uL — AB (ref 4.40–5.90)
RDW: 13.4 % (ref 11.5–14.5)
WBC: 5.9 10*3/uL (ref 3.8–10.6)

## 2016-11-19 LAB — COMPREHENSIVE METABOLIC PANEL
ALBUMIN: 3.9 g/dL (ref 3.5–5.0)
ALK PHOS: 32 U/L — AB (ref 38–126)
ALT: 40 U/L (ref 17–63)
ANION GAP: 12 (ref 5–15)
AST: 34 U/L (ref 15–41)
BILIRUBIN TOTAL: 0.9 mg/dL (ref 0.3–1.2)
BUN: 25 mg/dL — ABNORMAL HIGH (ref 6–20)
CALCIUM: 9.4 mg/dL (ref 8.9–10.3)
CO2: 24 mmol/L (ref 22–32)
Chloride: 102 mmol/L (ref 101–111)
Creatinine, Ser: 1.1 mg/dL (ref 0.61–1.24)
GFR calc non Af Amer: >60 mL/min (ref >60)
GLUCOSE: 164 mg/dL — AB (ref 65–99)
POTASSIUM: 4.2 mmol/L (ref 3.5–5.1)
SODIUM: 138 mmol/L (ref 135–145)
TOTAL PROTEIN: 8.4 g/dL — AB (ref 6.5–8.1)

## 2016-11-19 LAB — URINALYSIS, COMPLETE (UACMP) WITH MICROSCOPIC
BACTERIA UA: NONE SEEN
BILIRUBIN URINE: NEGATIVE
Glucose, UA: 50 mg/dL — AB
HGB URINE DIPSTICK: NEGATIVE
KETONES UR: NEGATIVE mg/dL
LEUKOCYTES UA: NEGATIVE
NITRITE: NEGATIVE
PROTEIN: NEGATIVE mg/dL
Specific Gravity, Urine: 1.011 (ref 1.005–1.030)
Squamous Epithelial / LPF: NONE SEEN
pH: 6 (ref 5.0–8.0)

## 2016-11-19 LAB — LIPASE, BLOOD: Lipase: 23 U/L (ref 11–51)

## 2016-11-19 MED ORDER — KETOROLAC TROMETHAMINE 30 MG/ML IJ SOLN
30.0000 mg | Freq: Once | INTRAMUSCULAR | Status: AC
  Administered 2016-11-19: 30 mg via INTRAVENOUS
  Filled 2016-11-19: qty 1

## 2016-11-19 MED ORDER — ONDANSETRON 4 MG PO TBDP: 4.0000 mg | ORAL_TABLET | Freq: Three times a day (TID) | ORAL | 0 refills | Status: DC | PRN

## 2016-11-19 MED ORDER — ONDANSETRON 4 MG PO TBDP
4.0000 mg | ORAL_TABLET | Freq: Once | ORAL | Status: AC
  Administered 2016-11-19: 4 mg via ORAL
  Filled 2016-11-19: qty 1

## 2016-11-19 MED ORDER — OXYCODONE-ACETAMINOPHEN 5-325 MG PO TABS
1.0000 | ORAL_TABLET | Freq: Once | ORAL | Status: AC
  Administered 2016-11-19: 1 via ORAL
  Filled 2016-11-19: qty 1

## 2016-11-19 MED ORDER — ONDANSETRON 4 MG PO TBDP: 4.0000 mg | ORAL_TABLET | Freq: Three times a day (TID) | ORAL | 0 refills | Status: AC | PRN

## 2016-11-19 MED ORDER — SODIUM CHLORIDE 0.9 % IV BOLUS (SEPSIS)
1000.0000 mL | Freq: Once | INTRAVENOUS | Status: AC
  Administered 2016-11-19: 1000 mL via INTRAVENOUS

## 2016-11-19 MED ORDER — HYDROMORPHONE HCL 1 MG/ML IJ SOLN
0.5000 mg | Freq: Once | INTRAMUSCULAR | Status: AC
  Administered 2016-11-19: 0.5 mg via INTRAVENOUS
  Filled 2016-11-19: qty 1

## 2016-11-19 MED ORDER — TAMSULOSIN HCL 0.4 MG PO CAPS: 0.4000 mg | ORAL_CAPSULE | Freq: Every day | ORAL | 0 refills | Status: AC

## 2016-11-19 MED ORDER — OXYCODONE-ACETAMINOPHEN 5-325 MG PO TABS: 1.0000 | ORAL_TABLET | ORAL | 0 refills | Status: AC | PRN

## 2016-11-19 MED ORDER — MORPHINE SULFATE (PF) 2 MG/ML IV SOLN: 2.0000 mg | Freq: Once | INTRAVENOUS | Status: DC

## 2016-11-19 MED ORDER — ONDANSETRON HCL 4 MG/2ML IJ SOLN
4.0000 mg | Freq: Once | INTRAMUSCULAR | Status: AC
  Administered 2016-11-19: 4 mg via INTRAVENOUS
  Filled 2016-11-19: qty 2

## 2016-11-19 MED ORDER — TAMSULOSIN HCL 0.4 MG PO CAPS
0.4000 mg | ORAL_CAPSULE | Freq: Once | ORAL | Status: AC
  Administered 2016-11-19: 0.4 mg via ORAL
  Filled 2016-11-19: qty 1

## 2016-11-19 NOTE — ED Notes (Signed)
Pt up to toilet 

## 2016-11-19 NOTE — ED Provider Notes (Signed)
[facility] Emergency Department Provider Note   ____________________________________________   First MD Initiated Contact with Patient 11/19/16 [id number]     (approximate)  I have reviewed the triage vital signs and the nursing notes.   HISTORY  Chief Complaint Abdominal Pain    HPI Robert Lester is a 66 y.o. male who presents to the ED from home with a chief complaint of abdominal pain. Patient awoke approximately one hour ago with right lower quadrant abdominal pain. Symptoms associated with nausea and vomiting. Thought he might be constipated and took a Senokot without relief. He went to bed in his usual state of health. Had a good appetite and ate a good dinner. Denies recent fever, chills, chest pain, shortness of breath, urinary symptoms, diarrhea. Denies recent travel or trauma. Nothing makes his symptoms better or worse. Patient has not experienced similar pain previously.   Past Medical History:  Diagnosis Date  . Arthritis   . Cancer ([place])   . Hypertension    [place]    Patient Active Problem List   Diagnosis Date Noted  . Hip arthritis Right 08/22/2011    Past Surgical History:  Procedure Laterality Date  . INGUINAL HERNIA REPAIR    . TOTAL HIP ARTHROPLASTY  08/22/2011   Procedure: TOTAL HIP ARTHROPLASTY;  Surgeon: Kerin Salen, MD;  Location: [place];  Service: Orthopedics;  Laterality: Right;  Right Total Hip    Prior to Admission medications   Medication Sig Start Date End Date Taking? Authorizing Provider  busPIRone (BUSPAR) 15 MG tablet Take 15 mg by mouth 2 (two) times daily.    [provider]  celecoxib (CELEBREX) 200 MG capsule Take 200 mg by mouth 2 (two) times daily.    [provider]  citalopram (CELEXA) 20 MG tablet Take 20 mg by mouth daily.    [provider]  lisinopril (PRINIVIL,ZESTRIL) 10 MG tablet Take 10 mg by mouth daily.    [provider]  lovastatin  (MEVACOR) 20 MG tablet Take 20 mg by mouth at bedtime.    [provider]  metoprolol succinate (TOPROL-XL) 25 MG 24 hr tablet Take 25 mg by mouth daily.    [provider]  warfarin (COUMADIN) 5 MG tablet Take 1 tablet (5 mg total) by mouth daily. 08/24/11 08/23/12  Leafy Kindle, PA-C  zolpidem (AMBIEN) 10 MG tablet Take 10 mg by mouth at bedtime as needed. For sleep    [provider]    Allergies Patient has no known allergies.  No family history on file.  Social History Social History  Substance Use Topics  . Smoking status: Current Some Day Smoker    Types: Cigars  . Smokeless tobacco: Never Used  . Alcohol use Yes    Review of Systems  Constitutional: No fever/chills. Eyes: No visual changes. ENT: No sore throat. Cardiovascular: Denies chest pain. Respiratory: Denies shortness of breath. Gastrointestinal: Positive for abdominal pain, nausea and vomiting.  No diarrhea.  No constipation. Genitourinary: Negative for dysuria. Musculoskeletal: Negative for back pain. Skin: Negative for rash. Neurological: Negative for headaches, focal weakness or numbness.   ____________________________________________   PHYSICAL EXAM:  VITAL SIGNS: ED Triage Vitals  Enc Vitals Group     BP 11/19/16 0420 (!) 215/125     Pulse Rate 11/19/16 0420 83     Resp 11/19/16 0420 20     Temp 11/19/16 0420 97.6 F (36.4 C)     Temp Source 11/19/16  0420 Oral     SpO2 11/19/16 0420 97 %     Weight 11/19/16 0424 212 lb (96.2 kg)     Height 11/19/16 0424 5\' 9"  (1.753 m)     Head Circumference --      Peak Flow --      Pain Score 11/19/16 0420 9     Pain Loc --      Pain Edu? --      Excl. in [place]? --     Constitutional: Alert and oriented. Uncomfortable appearing and in moderate acute distress. Eyes: Conjunctivae are normal. PERRL. EOMI. Head: Atraumatic. Nose: No congestion/rhinnorhea. Mouth/Throat: Mucous membranes are moist.  Oropharynx  non-erythematous. Neck: No stridor.   Cardiovascular: Normal rate, regular rhythm. Grossly normal heart sounds.  Good peripheral circulation. Respiratory: Normal respiratory effort.  No retractions. Lungs CTAB. Gastrointestinal: Soft and moderately tender to right lower quadrant. No distention. No abdominal bruits. No CVA tenderness. Musculoskeletal: No lower extremity tenderness nor edema.  No joint effusions. Neurologic:  Normal speech and language. No gross focal neurologic deficits are appreciated. Skin:  Skin is warm, diaphoretic and intact. No rash noted. Psychiatric: Mood and affect are normal. Speech and behavior are normal.  ____________________________________________   LABS (all labs ordered are listed, but only abnormal results are displayed)  Labs Reviewed  COMPREHENSIVE METABOLIC PANEL - Abnormal; Notable for the following:       Result Value   Glucose, Bld 164 (*)    BUN 25 (*)    Total Protein 8.4 (*)    Alkaline Phosphatase 32 (*)    All other components within normal limits  CBC - Abnormal; Notable for the following:    RBC 3.82 (*)    HCT 39.5 (*)    MCV 103.4 (*)    MCH 36.8 (*)    All other components within normal limits  URINALYSIS, COMPLETE (UACMP) WITH MICROSCOPIC - Abnormal; Notable for the following:    Color, Urine STRAW (*)    APPearance CLEAR (*)    Glucose, UA 50 (*)    All other components within normal limits  LIPASE, BLOOD   ____________________________________________  EKG  ED ECG REPORT I, SUNG,JADE J, the attending physician, personally viewed and interpreted this ECG.   Date: 11/19/2016  EKG Time: 0455  Rate: 84  Rhythm: normal EKG, normal sinus rhythm  Axis: LAD  Intervals:left anterior fascicular block  ST&T Change: Nonspecific  ____________________________________________  RADIOLOGY  Ct Renal Stone Study  Result Date: 11/19/2016 CLINICAL DATA:  Acute onset of severe right lower quadrant abdominal pain and diaphoresis.  Nausea. Initial encounter. EXAM: CT ABDOMEN AND PELVIS WITHOUT CONTRAST TECHNIQUE: Multidetector CT imaging of the abdomen and pelvis was performed following the standard protocol without IV contrast. COMPARISON:  CT of the abdomen and pelvis from 06/20/2014 FINDINGS: Lower chest: The visualized lung bases are clear. Scattered coronary artery calcifications are seen. Hepatobiliary: Diffuse fatty infiltration is noted within the liver. The gallbladder is unremarkable in appearance. The common bile duct remains normal in caliber. Pancreas: The pancreas is within normal limits. Spleen: The spleen is unremarkable in appearance. Adrenals/Urinary Tract: The adrenal glands are unremarkable in appearance. Mild right-sided hydronephrosis is noted, with right-sided perinephric stranding, and fluid tracking along the course of the right ureter. An obstructing 6 x 5 mm stone is noted at the distal right ureter, just above the right vesicoureteral junction. The left kidney is grossly unremarkable. No nonobstructing renal stones are identified. Stomach/Bowel: The stomach is unremarkable in appearance.  The small bowel is within normal limits. The appendix is normal in caliber, without evidence of appendicitis. The colon is unremarkable in appearance. Vascular/Lymphatic: Scattered calcification is seen along the abdominal aorta and its branches. The abdominal aorta is otherwise grossly unremarkable. The inferior vena cava is grossly unremarkable. Previously retroperitoneal lymphadenopathy has largely resolved. No pelvic sidewall lymphadenopathy is identified. Reproductive: The bladder is mildly distended and grossly unremarkable. The prostate is diminutive, with scattered calcification. Other: No additional soft tissue abnormalities are seen. Musculoskeletal: Scattered metastatic disease is again noted within the visualized osseous structures. The patient's right hip arthroplasty is incompletely imaged, but appears grossly  unremarkable. The visualized musculature is unremarkable in appearance. IMPRESSION: 1. Mild right-sided hydronephrosis, with fluid tracking along the course of the right ureter. Obstructing 6 x 5 mm stone at the distal right ureter, just above the right vesicoureteral junction. 2. Scattered aortic atherosclerosis. 3. Diffuse fatty infiltration within the liver. 4. Scattered coronary artery calcifications seen. 5. Metastatic disease again noted within the visualized osseous structures. Electronically Signed   By: Garald Balding M.D.   On: 11/19/2016 05:17    ____________________________________________   PROCEDURES  Procedure(s) performed: None  Procedures  Critical Care performed: No  ____________________________________________   INITIAL IMPRESSION / ASSESSMENT AND PLAN / ED COURSE  Pertinent labs & imaging results that were available during my care of the patient were reviewed by me and considered in my medical decision making (see chart for details).  66 year old male with metastatic prostate cancer, on abiraterone/prednisone, who presents with sudden onset right lower quadrant abdominal pain, nausea and vomiting. Review of records reveal that patient had recent oncology follow-up on 6/18 with CT scan with contrast which revealed normal caliber aorta. Given suddenness of patient's pain and his presenting clinical picture, appendicitis less likely. Will obtain a noncontrast renal stone CT; initiate IV fluid resuscitation, administer IV analgesia and reassess.  Clinical Course as of Nov 19 653  Sat Nov 19, 2016  7829 Updated patient of CT imaging results. Complains of persistent pain. Will trial Toradol.  [JS]  0608 Pain better after Toradol, now 5/10. Will administer morphine. Patient was placed on nasal cannula oxygen for desaturation after Dilaudid administration. Will discuss with urology for management recommendations.  [JS]  [id number] Discussed with Dr. Pilar Jarvis from urology; recommends  oral analgesia and discharged home with follow-up in the office next week. Does not recommend urgent surgical procedure nor hospitalization. Discussed with patient and his spouse; patient prefers to be discharged home. Will discontinue morphine order and instead administer a Percocet and initiate Flomax.  [JS]  0653 Pain much improved, patient smiling. Oxygen was removed and patient is saturating 94-97% on room air. He is due for blood pressure medicines this morning and I encouraged him to take them as soon as he gets home. Strict return precautions given. Patient verbalizes understanding and agrees with plan of care.  [JS]    Clinical Course User Index [JS] Paulette Blanch, MD     ____________________________________________   FINAL CLINICAL IMPRESSION(S) / ED DIAGNOSES  Final diagnoses:  Right lower quadrant abdominal pain      NEW MEDICATIONS STARTED DURING THIS VISIT:  New Prescriptions   No medications on file     Note:  This document was prepared using Dragon voice recognition software and may include unintentional dictation errors.    Paulette Blanch, MD 11/19/16 [phone]

## 2016-11-19 NOTE — Discharge Instructions (Signed)
1. Take pain & nausea medicines as needed (Percocet/Zofran #30). Make sure to take a stool softener while taking narcotic pain medicines. 2. Take Flomax 0.4mg daily x 14 days. 3. Drink plenty of bottled or filtered water daily. 4. Return to the ER for worsening symptoms, persistent vomiting, fever, difficulty breathing or other concerns.  

## 2016-11-19 NOTE — ED Notes (Signed)
Pt back from CT

## 2016-11-19 NOTE — ED Notes (Signed)
Pt placed on 2L of O2. Pt O2 dropped to 83-86%. Pt now is 97%

## 2016-11-19 NOTE — ED Triage Notes (Signed)
Pt presents to ED with c/o severe right lower abd pain; onset approx 1 hour ago. Denies similar pain. Pt states he thought he might be constipated and took senokot with no relief. Pt pale and diaphoretic upon arrival. Taken immediately to room. +nausea. Lower abd tender to touch.

## 2016-11-19 NOTE — ED Notes (Signed)
Patient transported to CT 

## 2016-11-19 NOTE — ED Notes (Signed)
Pt taken off of O2 and maintaining oxygen at 93%. Pt resting comfortably at this time.

## 2016-11-23 ENCOUNTER — Encounter: Payer: Self-pay | Admitting: *Deleted

## 2016-11-23 ENCOUNTER — Ambulatory Visit: Payer: Medicare Other | Admitting: Anesthesiology

## 2016-11-23 ENCOUNTER — Encounter: Payer: Self-pay | Admitting: Urology

## 2016-11-23 ENCOUNTER — Ambulatory Visit
Admission: RE | Admit: 2016-11-23 | Discharge: 2016-11-23 | Disposition: A | Discharge status: Home / Self Care | Payer: Medicare Other | Source: Ambulatory Visit | Attending: Urology | Admitting: Urology

## 2016-11-23 ENCOUNTER — Encounter
Admission: RE | Disposition: A | Discharge status: Home / Self Care | Payer: Self-pay | Source: Ambulatory Visit | Attending: Urology

## 2016-11-23 ENCOUNTER — Ambulatory Visit: Payer: Self-pay | Admitting: Urology

## 2016-11-23 DIAGNOSIS — Z79899 Other long term (current) drug therapy: Secondary | ICD-10-CM | POA: Insufficient documentation

## 2016-11-23 DIAGNOSIS — N201 Calculus of ureter: Secondary | ICD-10-CM

## 2016-11-23 DIAGNOSIS — Z79891 Long term (current) use of opiate analgesic: Secondary | ICD-10-CM | POA: Diagnosis not present

## 2016-11-23 DIAGNOSIS — K219 Gastro-esophageal reflux disease without esophagitis: Secondary | ICD-10-CM | POA: Insufficient documentation

## 2016-11-23 DIAGNOSIS — Z791 Long term (current) use of non-steroidal anti-inflammatories (NSAID): Secondary | ICD-10-CM | POA: Diagnosis not present

## 2016-11-23 DIAGNOSIS — I10 Essential (primary) hypertension: Secondary | ICD-10-CM | POA: Insufficient documentation

## 2016-11-23 DIAGNOSIS — F17290 Nicotine dependence, other tobacco product, uncomplicated: Secondary | ICD-10-CM | POA: Insufficient documentation

## 2016-11-23 DIAGNOSIS — N132 Hydronephrosis with renal and ureteral calculous obstruction: Secondary | ICD-10-CM | POA: Insufficient documentation

## 2016-11-23 DIAGNOSIS — C61 Malignant neoplasm of prostate: Secondary | ICD-10-CM | POA: Diagnosis not present

## 2016-11-23 DIAGNOSIS — F329 Major depressive disorder, single episode, unspecified: Secondary | ICD-10-CM | POA: Diagnosis not present

## 2016-11-23 DIAGNOSIS — R109 Unspecified abdominal pain: Secondary | ICD-10-CM | POA: Diagnosis present

## 2016-11-23 DIAGNOSIS — Z7952 Long term (current) use of systemic steroids: Secondary | ICD-10-CM | POA: Diagnosis not present

## 2016-11-23 DIAGNOSIS — Z96641 Presence of right artificial hip joint: Secondary | ICD-10-CM | POA: Insufficient documentation

## 2016-11-23 HISTORY — PX: CYSTOSCOPY WITH STENT PLACEMENT: SHX5790

## 2016-11-23 HISTORY — PX: HOLMIUM LASER APPLICATION: SHX5852

## 2016-11-23 HISTORY — DX: Idiopathic aseptic necrosis of right femur: M87.051

## 2016-11-23 HISTORY — PX: URETEROSCOPY WITH HOLMIUM LASER LITHOTRIPSY: SHX6645

## 2016-11-23 HISTORY — DX: Depression, unspecified: F32.A

## 2016-11-23 HISTORY — DX: Major depressive disorder, single episode, unspecified: F32.9

## 2016-11-23 HISTORY — DX: Hyperlipidemia, unspecified: E78.5

## 2016-11-23 SURGERY — URETEROSCOPY, WITH LITHOTRIPSY USING HOLMIUM LASER
Anesthesia: General | Site: Ureter | Laterality: Right | Wound class: Clean Contaminated

## 2016-11-23 MED ORDER — FUROSEMIDE 10 MG/ML IJ SOLN
10.0000 mg | Freq: Once | INTRAMUSCULAR | Status: AC
  Administered 2016-11-23: 10 mg via INTRAVENOUS

## 2016-11-23 MED ORDER — URIBEL 118 MG PO CAPS: 1.0000 | ORAL_CAPSULE | Freq: Four times a day (QID) | ORAL | 3 refills | Status: AC | PRN

## 2016-11-23 MED ORDER — PHENYLEPHRINE HCL 10 MG/ML IJ SOLN
INTRAMUSCULAR | Status: DC | PRN
  Administered 2016-11-23 (×3): 200 ug via INTRAVENOUS
  Administered 2016-11-23: 100 ug via INTRAVENOUS

## 2016-11-23 MED ORDER — LIDOCAINE HCL (PF) 2 % IJ SOLN
INTRAMUSCULAR | Status: DC | PRN
  Administered 2016-11-23: 50 mg

## 2016-11-23 MED ORDER — FUROSEMIDE 10 MG/ML IJ SOLN
INTRAMUSCULAR | Status: AC
  Filled 2016-11-23: qty 2

## 2016-11-23 MED ORDER — BELLADONNA ALKALOIDS-OPIUM 16.2-60 MG RE SUPP
RECTAL | Status: DC | PRN
  Administered 2016-11-23: 1 via RECTAL

## 2016-11-23 MED ORDER — LIDOCAINE HCL 2 % EX GEL
CUTANEOUS | Status: DC | PRN
  Administered 2016-11-23: 1 via URETHRAL

## 2016-11-23 MED ORDER — CIPROFLOXACIN HCL 500 MG PO TABS: 500.0000 mg | ORAL_TABLET | Freq: Two times a day (BID) | ORAL | 0 refills | Status: AC

## 2016-11-23 MED ORDER — LIDOCAINE HCL (PF) 2 % IJ SOLN
INTRAMUSCULAR | Status: AC
  Filled 2016-11-23: qty 2

## 2016-11-23 MED ORDER — LIDOCAINE HCL 2 % EX GEL
CUTANEOUS | Status: AC
  Filled 2016-11-23: qty 10

## 2016-11-23 MED ORDER — CEFAZOLIN (ANCEF) 1 G IV SOLR: 1.0000 g | INTRAVENOUS | Status: DC

## 2016-11-23 MED ORDER — BELLADONNA ALKALOIDS-OPIUM 16.2-60 MG RE SUPP
RECTAL | Status: AC
  Filled 2016-11-23: qty 1

## 2016-11-23 MED ORDER — CEFAZOLIN SODIUM-DEXTROSE 1-4 GM/50ML-% IV SOLN
1.0000 g | Freq: Once | INTRAVENOUS | Status: AC
  Administered 2016-11-23: 1 g via INTRAVENOUS

## 2016-11-23 MED ORDER — EPHEDRINE SULFATE 50 MG/ML IJ SOLN
INTRAMUSCULAR | Status: AC
  Filled 2016-11-23: qty 1

## 2016-11-23 MED ORDER — FENTANYL CITRATE (PF) 100 MCG/2ML IJ SOLN: 25.0000 ug | INTRAMUSCULAR | Status: DC | PRN

## 2016-11-23 MED ORDER — GLYCOPYRROLATE 0.2 MG/ML IJ SOLN
INTRAMUSCULAR | Status: DC | PRN
  Administered 2016-11-23: 0.2 mg via INTRAVENOUS

## 2016-11-23 MED ORDER — ONDANSETRON HCL 4 MG/2ML IJ SOLN: 4.0000 mg | Freq: Once | INTRAMUSCULAR | Status: DC | PRN

## 2016-11-23 MED ORDER — LACTATED RINGERS IV SOLN
INTRAVENOUS | Status: DC
  Administered 2016-11-23: 15:00:00 via INTRAVENOUS

## 2016-11-23 MED ORDER — FENTANYL CITRATE (PF) 100 MCG/2ML IJ SOLN
INTRAMUSCULAR | Status: DC | PRN
Start: 2016-11-23 — End: 2016-11-23
  Administered 2016-11-23: 50 ug via INTRAVENOUS
  Administered 2016-11-23 (×2): 25 ug via INTRAVENOUS

## 2016-11-23 MED ORDER — PROPOFOL 10 MG/ML IV BOLUS
INTRAVENOUS | Status: AC
  Filled 2016-11-23: qty 20

## 2016-11-23 MED ORDER — PROPOFOL 10 MG/ML IV BOLUS
INTRAVENOUS | Status: DC | PRN
  Administered 2016-11-23: 150 mg via INTRAVENOUS

## 2016-11-23 MED ORDER — EPHEDRINE SULFATE 50 MG/ML IJ SOLN
INTRAMUSCULAR | Status: DC | PRN
  Administered 2016-11-23 (×3): 10 mg via INTRAVENOUS

## 2016-11-23 MED ORDER — MIDAZOLAM HCL 5 MG/5ML IJ SOLN
INTRAMUSCULAR | Status: DC | PRN
  Administered 2016-11-23: 2 mg via INTRAVENOUS

## 2016-11-23 MED ORDER — MIDAZOLAM HCL 2 MG/2ML IJ SOLN
INTRAMUSCULAR | Status: AC
  Filled 2016-11-23: qty 2

## 2016-11-23 MED ORDER — IOTHALAMATE MEGLUMINE 43 % IV SOLN: INTRAVENOUS | Status: DC | PRN

## 2016-11-23 MED ORDER — GLYCOPYRROLATE 0.2 MG/ML IJ SOLN
INTRAMUSCULAR | Status: AC
  Filled 2016-11-23: qty 1

## 2016-11-23 MED ORDER — CEFAZOLIN SODIUM-DEXTROSE 1-4 GM/50ML-% IV SOLN
INTRAVENOUS | Status: AC
  Filled 2016-11-23: qty 50

## 2016-11-23 MED ORDER — ONDANSETRON HCL 4 MG/2ML IJ SOLN
INTRAMUSCULAR | Status: DC | PRN
  Administered 2016-11-23: 4 mg via INTRAVENOUS

## 2016-11-23 MED ORDER — ONDANSETRON HCL 4 MG/2ML IJ SOLN
INTRAMUSCULAR | Status: AC
  Filled 2016-11-23: qty 2

## 2016-11-23 MED ORDER — FENTANYL CITRATE (PF) 100 MCG/2ML IJ SOLN
INTRAMUSCULAR | Status: AC
  Filled 2016-11-23: qty 2

## 2016-11-23 SURGICAL SUPPLY — 22 items
BAG DRAIN CYSTO-URO LG1000N (MISCELLANEOUS) ×3 IMPLANT
CATH URETL 5X70 OPEN END (CATHETERS) ×3 IMPLANT
CNTNR SPEC 2.5X3XGRAD LEK (MISCELLANEOUS) ×1
CONRAY 43 FOR UROLOGY 50M (MISCELLANEOUS) ×3 IMPLANT
CONT SPEC 4OZ STER OR WHT (MISCELLANEOUS) ×2
CONTAINER SPEC 2.5X3XGRAD LEK (MISCELLANEOUS) ×1 IMPLANT
FIBER LASER 365 (Laser) IMPLANT
FIBER LASER 550 (Laser) ×3 IMPLANT
GLOVE BIO SURGEON STRL SZ7 (GLOVE) ×6 IMPLANT
GOWN STRL REUS W/ TWL LRG LVL4 (GOWN DISPOSABLE) ×1 IMPLANT
GOWN STRL REUS W/TWL LRG LVL4 (GOWN DISPOSABLE) ×2
GOWN STRL REUS W/TWL XL LVL4 (GOWN DISPOSABLE) ×3 IMPLANT
GUIDEWIRE STR ZIPWIRE 035X150 (MISCELLANEOUS) ×3 IMPLANT
KIT RM TURNOVER CYSTO AR (KITS) ×3 IMPLANT
PACK CYSTO AR (MISCELLANEOUS) ×3 IMPLANT
SET CYSTO W/LG BORE CLAMP LF (SET/KITS/TRAYS/PACK) ×3 IMPLANT
SOL .9 NS 3000ML IRR  AL (IV SOLUTION) ×2
SOL .9 NS 3000ML IRR UROMATIC (IV SOLUTION) ×1 IMPLANT
SOL PREP PVP 2OZ (MISCELLANEOUS) ×3
SOLUTION PREP PVP 2OZ (MISCELLANEOUS) ×1 IMPLANT
SURGILUBE 2OZ TUBE FLIPTOP (MISCELLANEOUS) ×3 IMPLANT
WATER STERILE IRR 1000ML POUR (IV SOLUTION) ×3 IMPLANT

## 2016-11-23 NOTE — Anesthesia Postprocedure Evaluation (Signed)
Anesthesia Post Note  Patient: Javarri Segal  Procedure(s) Performed: Procedure(s) (LRB): URETEROSCOPY WITH HOLMIUM LASER LITHOTRIPSY (Right) HOLMIUM LASER APPLICATION (Right) CYSTOSCOPY WITH STENT PLACEMENT (Right)  Patient location during evaluation: PACU Anesthesia Type: General Level of consciousness: awake and alert Pain management: pain level controlled Vital Signs Assessment: post-procedure vital signs reviewed and stable Respiratory status: spontaneous breathing and respiratory function stable Cardiovascular status: stable Anesthetic complications: no     Last Vitals:  Vitals:   11/23/16 1736 11/23/16 1737  BP: (P) 121/81 121/81  Pulse: (P) 92 94  Resp: (P) 16 19  Temp: (!) (P) 36.4 C (!) 36.4 C    Last Pain:  Vitals:   11/23/16 1439  TempSrc: Oral  PainSc: 2                  KEPHART,WILLIAM K

## 2016-11-23 NOTE — Anesthesia Preprocedure Evaluation (Signed)
Anesthesia Evaluation  Patient identified by MRN, date of birth, ID band Patient awake    Reviewed: Allergy & Precautions, NPO status , Patient's Chart, lab work & pertinent test results  History of Anesthesia Complications Negative for: history of anesthetic complications  Airway Mallampati: II       Dental   Pulmonary Current Smoker,           Cardiovascular hypertension, Pt. on medications and Pt. on home beta blockers      Neuro/Psych Depression negative neurological ROS     GI/Hepatic negative GI ROS, Neg liver ROS,   Endo/Other  negative endocrine ROS  Renal/GU negative Renal ROS     Musculoskeletal   Abdominal   Peds  Hematology   Anesthesia Other Findings   Reproductive/Obstetrics                            Anesthesia Physical Anesthesia Plan  ASA: II  Anesthesia Plan: General   Post-op Pain Management:    Induction:   PONV Risk Score and Plan:   Airway Management Planned: Oral ETT and LMA  Additional Equipment:   Intra-op Plan:   Post-operative Plan:   Informed Consent: I have reviewed the patients History and Physical, chart, labs and discussed the procedure including the risks, benefits and alternatives for the proposed anesthesia with the patient or authorized representative who has indicated his/her understanding and acceptance.     Plan Discussed with:   Anesthesia Plan Comments:         Anesthesia Quick Evaluation

## 2016-11-23 NOTE — Discharge Instructions (Signed)
AMBULATORY SURGERY  DISCHARGE INSTRUCTIONS   1) The drugs that you were given will stay in your system until tomorrow so for the next 24 hours you should not:  A) Drive an automobile B) Make any legal decisions C) Drink any alcoholic beverage   2) You may resume regular meals tomorrow.  Today it is better to start with liquids and gradually work up to solid foods.  You may eat anything you prefer, but it is better to start with liquids, then soup and crackers, and gradually work up to solid foods.   3) Please notify your doctor immediately if you have any unusual bleeding, trouble breathing, redness and pain at the surgery site, drainage, fever, or pain not relieved by medication.    4) Additional Instructions:        Please contact your physician with any problems or Same Day Surgery at [phone], Monday through Friday 6 am to 4 pm, or [place] [facility] number at [phone].Kidney Stones Kidney stones (urolithiasis) are rock-like masses that form inside of the kidneys. Kidneys are organs that make pee (urine). A kidney stone can cause very bad pain and can block the flow of pee. The stone usually leaves your body (passes) through your pee. You may need to have a doctor take out the stone. Follow these instructions at home: Eating and drinking  Drink enough fluid to keep your pee clear or pale yellow. This will help you pass the stone.  If told by your doctor, change the foods you eat (your diet). This may include: ? Limiting how much salt (sodium) you eat. ? Eating more fruits and vegetables. ? Limiting how much meat, poultry, fish, and eggs you eat.  Follow instructions from your doctor about eating or drinking restrictions. General instructions  Collect pee samples as told by your doctor. You may need to collect a pee sample: ? 24 hours after a stone comes out. ? 8-12 weeks after a stone comes out, and every 6-12 months after that.  Strain your pee  every time you pee (urinate), for as long as told. Use the strainer that your doctor recommends.  Do not throw out the stone. Keep it so that it can be tested by your doctor.  Take over-the-counter and prescription medicines only as told by your doctor.  Keep all follow-up visits as told by your doctor. This is important. You may need follow-up tests. Preventing kidney stones To prevent another kidney stone:  Drink enough fluid to keep your pee clear or pale yellow. This is the best way to prevent kidney stones.  Eat healthy foods.  Avoid certain foods as told by your doctor. You may be told to eat less protein.  Stay at a healthy weight.  Contact a doctor if:  You have pain that gets worse or does not get better with medicine. Get help right away if:  You have a fever or chills.  You get very bad pain.  You get new pain in your belly (abdomen).  You pass out (faint).  You cannot pee. This information is not intended to replace advice given to you by your health care provider. Make sure you discuss any questions you have with your health care provider. Document Released: 10/19/2007 Document Revised: 01/19/2016 Document Reviewed: 01/19/2016 Elsevier Interactive Patient Education  2017 [employer]  Ureteral Stent Implantation, Care After Refer to this sheet in the next few weeks. These instructions provide you with information about caring for yourself after your  procedure. Your health care provider may also give you more specific instructions. Your treatment has been planned according to current medical practices, but problems sometimes occur. Call your health care provider if you have any problems or questions after your procedure. What can I expect after the procedure? After the procedure, it is common to have:  Nausea.  Mild pain when you urinate. You may feel this pain in your lower back or lower abdomen. Pain should stop within a few minutes after you urinate. This  may last for up to 1 week.  A small amount of blood in your urine for several days.  Follow these instructions at home:  Medicines  Take over-the-counter and prescription medicines only as told by your health care provider.  If you were prescribed an antibiotic medicine, take it as told by your health care provider. Do not stop taking the antibiotic even if you start to feel better.  Do not drive for 24 hours if you received a sedative.  Do not drive or operate heavy machinery while taking prescription pain medicines. Activity  Return to your normal activities as told by your health care provider. Ask your health care provider what activities are safe for you.  Do not lift anything that is heavier than 10 lb (4.5 kg). Follow this limit for 1 week after your procedure, or for as long as told by your health care provider. General instructions  Watch for any blood in your urine. Call your health care provider if the amount of blood in your urine increases.  If you have a catheter: ? Follow instructions from your health care provider about taking care of your catheter and collection bag. ? Do not take baths, swim, or use a hot tub until your health care provider approves.  Drink enough fluid to keep your urine clear or pale yellow.  Keep all follow-up visits as told by your health care provider. This is important. Contact a health care provider if:  You have pain that gets worse or does not get better with medicine, especially pain when you urinate.  You have difficulty urinating.  You feel nauseous or you vomit repeatedly during a period of more than 2 days after the procedure. Get help right away if:  Your urine is dark red or has blood clots in it.  You are leaking urine (have incontinence).  The end of the stent comes out of your urethra.  You cannot urinate.  You have sudden, sharp, or severe pain in your abdomen or lower back.  You have a fever. This information  is not intended to replace advice given to you by your health care provider. Make sure you discuss any questions you have with your health care provider. Document Released: 01/02/2013 Document Revised: 10/08/2015 Document Reviewed: 11/14/2014 Elsevier Interactive Patient Education  [employer].

## 2016-11-23 NOTE — Op Note (Signed)
Preoperative diagnosis: 1. Right ureterolithiasis                                             2. Right hydronephrosis  Postoperative diagnosis: Same   Procedure: 1. Right ureteroscopic ureterolithotomy with holmium laser lithotripsy                      2. Right double pigtail stent placement                      3. Fluoroscopy  Surgeon: Otelia Limes. Yves Dill MD  Anesthesia: General  Indications:See the history and physical. After informed consent the above procedure(s) were requested     Technique and findings: After adequate general anesthesia been obtained the patient was placed into dorsal lithotomy position and the perineum was prepped and draped in the usual fashion. Fluoroscopy was performed and a stone could not be identified. The short 4.5 French ureteroscope was coupled the camera and visually advanced into the distal right ureter. 2 stones were identified in this location. The 550  holmium laser fiber as introduced through the scope and power set at 1 with a frequency of 5. Stones were fully powdered. At this point the ureteroscope was removed and the cystoscope visually advanced into the bladder. A 0.035 Glidewire was advanced up the ureter under fluoroscopic guidance. A 6 French x 24 cm double pigtail stent was advanced over the guidewire and positioned in the ureter. The guidewire was then removed taking care leaving the stent in position. Retrieval suture was left attached to the stent. The bladder was drained and the cystoscope was removed. 10 cc of viscous Xylocaine was instilled within the urethra and the bladder. A B&O suppository was placed. The procedure was terminated and patient transferred to the recovery room in stable condition.

## 2016-11-23 NOTE — Anesthesia Post-op Follow-up Note (Cosign Needed)
Anesthesia QCDR form completed.        

## 2016-11-23 NOTE — Transfer of Care (Signed)
Immediate Anesthesia Transfer of Care Note  Patient: Walden Statz  Procedure(s) Performed: Procedure(s): URETEROSCOPY WITH HOLMIUM LASER LITHOTRIPSY (Right) HOLMIUM LASER APPLICATION (Right) CYSTOSCOPY WITH STENT PLACEMENT (Right)  Patient Location: PACU  Anesthesia Type:General  Level of Consciousness: sedated  Airway & Oxygen Therapy: Patient Spontanous Breathing and Patient connected to face mask oxygen  Post-op Assessment: Report given to RN and Post -op Vital signs reviewed and stable  Post vital signs: Reviewed  Last Vitals:  Vitals:   11/23/16 1736 11/23/16 1737  BP: (P) 121/81 121/81  Pulse: (P) 92 94  Resp: (P) 16 19  Temp: (!) (P) 36.4 C (!) 36.4 C    Last Pain:  Vitals:   11/23/16 1439  TempSrc: Oral  PainSc: 2          Complications: No apparent anesthesia complications

## 2016-11-23 NOTE — H&P (Signed)
Date of Initial H&P: 11/23/16  History reviewed, patient examined, no change in status, stable for surgery.

## 2016-11-23 NOTE — H&P (Signed)
Urology Admission H&P  Chief Complaint: R flank pain  History of Present Illness: Sudden onset R flank pain, N/V and RQ pain 11/19/16. CT scan : R distal 6X5 mm stone.  Past Medical History:  Diagnosis Date  . Arthritis   . Cancer ([place])   . Hypertension    [place]   Past Surgical History:  Procedure Laterality Date  . INGUINAL HERNIA REPAIR    . TOTAL HIP ARTHROPLASTY  08/22/2011   Procedure: TOTAL HIP ARTHROPLASTY;  Surgeon: Kerin Salen, MD;  Location: [facility];  Service: Orthopedics;  Laterality: Right;  Right Total Hip  Bilateral orchiectomy 2015  Home Medications:  Zytiga, caltrate 600+D, cymbalta, lisinopril, ativan, metoprolol, aleve, prilosec, prednisone, senokot, tamsulosin, zofran, oxycontin Allergies: No Known Allergies  No family history on file. Social History:  reports that he has been smoking Cigars.  He has never used smokeless tobacco. He reports that he drinks alcohol. He reports that he does not use drugs.  ROS Metastatic prostate cancer. HTN. GERD. Depression.  Physical Exam:  Vital signs in last 24 hours: T 98.6, BP 90/63, P 88, Wt 208 lbs.   Physical Exam WNWM in NAD                           HEENT:  PEERL, EOMI                           Pulmonary: lungs clear to auscultation                           CV:  RRR                           Abd: Soft. No CVAT                           Neuro: Alert and orientedX3  Laboratory Data:  No results found for this or any previous visit (from the past 24 hour(s)). No results found for this or any previous visit (from the past 240 hour(s)).   Recent Labs  11/19/16 0426  CREATININE 1.10     Impression/Assessment:  Right ureterolithiasis  Plan:  Right ureteroscopic ureterolithotomy with Holmium laser lithotripsy  Masie Bermingham R 11/23/2016, 11:55 AM

## 2016-11-23 NOTE — Anesthesia Procedure Notes (Signed)
Procedure Name: LMA Insertion Performed by: Katelyne Galster Pre-anesthesia Checklist: Patient identified, Patient being monitored, Timeout performed, Emergency Drugs available and Suction available Patient Re-evaluated:Patient Re-evaluated prior to induction Oxygen Delivery Method: Circle system utilized Preoxygenation: Pre-oxygenation with 100% oxygen Induction Type: IV induction Ventilation: Mask ventilation without difficulty LMA: LMA inserted LMA Size: 4.5 Tube type: Oral Number of attempts: 1 Placement Confirmation: positive ETCO2 and breath sounds checked- equal and bilateral Tube secured with: Tape Dental Injury: Teeth and Oropharynx as per pre-operative assessment        

## 2016-11-24 ENCOUNTER — Encounter: Payer: Self-pay | Admitting: Urology

## 2017-12-14 DEATH — deceased
